# Patient Record
Sex: Female | Born: 1950 | Race: White | Hispanic: No | Marital: Married | Smoking: Current every day smoker
Health system: Southern US, Community
[De-identification: ages and names within clinical notes are randomized; demographics above are authoritative.]

## PROBLEM LIST (undated history)

## (undated) DIAGNOSIS — Z87898 Personal history of other specified conditions: Secondary | ICD-10-CM

## (undated) DIAGNOSIS — F32A Depression, unspecified: Secondary | ICD-10-CM

## (undated) DIAGNOSIS — F419 Anxiety disorder, unspecified: Secondary | ICD-10-CM

## (undated) DIAGNOSIS — M25562 Pain in left knee: Secondary | ICD-10-CM

## (undated) DIAGNOSIS — F329 Major depressive disorder, single episode, unspecified: Secondary | ICD-10-CM

## (undated) DIAGNOSIS — R0609 Other forms of dyspnea: Secondary | ICD-10-CM

## (undated) DIAGNOSIS — N159 Renal tubulo-interstitial disease, unspecified: Secondary | ICD-10-CM

## (undated) DIAGNOSIS — I200 Unstable angina: Secondary | ICD-10-CM

## (undated) DIAGNOSIS — R0600 Dyspnea, unspecified: Secondary | ICD-10-CM

## (undated) DIAGNOSIS — H9319 Tinnitus, unspecified ear: Secondary | ICD-10-CM

## (undated) DIAGNOSIS — R531 Weakness: Secondary | ICD-10-CM

## (undated) DIAGNOSIS — M1990 Unspecified osteoarthritis, unspecified site: Secondary | ICD-10-CM

## (undated) DIAGNOSIS — R42 Dizziness and giddiness: Secondary | ICD-10-CM

## (undated) DIAGNOSIS — R079 Chest pain, unspecified: Secondary | ICD-10-CM

## (undated) HISTORY — DX: Chest pain, unspecified: R07.9

## (undated) HISTORY — DX: Other forms of dyspnea: R06.09

## (undated) HISTORY — PX: CARDIAC CATHETERIZATION: SHX172

## (undated) HISTORY — DX: Depression, unspecified: F32.A

## (undated) HISTORY — DX: Dyspnea, unspecified: R06.00

## (undated) HISTORY — DX: Unspecified osteoarthritis, unspecified site: M19.90

## (undated) HISTORY — DX: Anxiety disorder, unspecified: F41.9

## (undated) HISTORY — DX: Major depressive disorder, single episode, unspecified: F32.9

## (undated) HISTORY — DX: Dizziness and giddiness: R42

## (undated) HISTORY — DX: Personal history of other specified conditions: Z87.898

## (undated) HISTORY — DX: Weakness: R53.1

## (undated) HISTORY — DX: Unstable angina: I20.0

## (undated) HISTORY — DX: Tinnitus, unspecified ear: H93.19

## (undated) HISTORY — PX: ABDOMINAL HYSTERECTOMY: SHX81

## (undated) HISTORY — PX: CERVICAL FUSION: SHX112

## (undated) HISTORY — DX: Pain in left knee: M25.562

## (undated) HISTORY — DX: Renal tubulo-interstitial disease, unspecified: N15.9

---

## 1997-12-09 ENCOUNTER — Other Ambulatory Visit: Admission: RE | Admit: 1997-12-09 | Discharge: 1997-12-09 | Payer: Self-pay | Admitting: *Deleted

## 1997-12-26 ENCOUNTER — Other Ambulatory Visit: Admission: RE | Admit: 1997-12-26 | Discharge: 1997-12-26 | Payer: Self-pay | Admitting: *Deleted

## 1998-05-07 ENCOUNTER — Other Ambulatory Visit: Admission: RE | Admit: 1998-05-07 | Discharge: 1998-05-07 | Payer: Self-pay | Admitting: *Deleted

## 1998-08-07 ENCOUNTER — Other Ambulatory Visit: Admission: RE | Admit: 1998-08-07 | Discharge: 1998-08-07 | Payer: Self-pay | Admitting: *Deleted

## 1998-09-21 ENCOUNTER — Ambulatory Visit (HOSPITAL_COMMUNITY): Admission: RE | Admit: 1998-09-21 | Discharge: 1998-09-21 | Payer: Self-pay | Admitting: *Deleted

## 1999-03-24 ENCOUNTER — Ambulatory Visit (HOSPITAL_COMMUNITY): Admission: RE | Admit: 1999-03-24 | Discharge: 1999-03-24 | Payer: Self-pay | Admitting: Family Medicine

## 1999-03-24 ENCOUNTER — Encounter: Payer: Self-pay | Admitting: Family Medicine

## 1999-06-15 ENCOUNTER — Other Ambulatory Visit: Admission: RE | Admit: 1999-06-15 | Discharge: 1999-06-15 | Payer: Self-pay | Admitting: *Deleted

## 1999-07-14 ENCOUNTER — Encounter: Payer: Self-pay | Admitting: *Deleted

## 1999-07-20 ENCOUNTER — Encounter (INDEPENDENT_AMBULATORY_CARE_PROVIDER_SITE_OTHER): Payer: Self-pay

## 1999-07-20 ENCOUNTER — Inpatient Hospital Stay (HOSPITAL_COMMUNITY): Admission: RE | Admit: 1999-07-20 | Discharge: 1999-07-23 | Payer: Self-pay | Admitting: *Deleted

## 1999-10-05 ENCOUNTER — Encounter: Admission: RE | Admit: 1999-10-05 | Discharge: 1999-10-05 | Payer: Self-pay | Admitting: *Deleted

## 1999-10-05 ENCOUNTER — Encounter: Payer: Self-pay | Admitting: *Deleted

## 1999-10-08 ENCOUNTER — Encounter: Admission: RE | Admit: 1999-10-08 | Discharge: 1999-10-08 | Payer: Self-pay | Admitting: *Deleted

## 1999-10-08 ENCOUNTER — Encounter: Payer: Self-pay | Admitting: *Deleted

## 1999-10-13 ENCOUNTER — Inpatient Hospital Stay (HOSPITAL_COMMUNITY): Admission: EM | Admit: 1999-10-13 | Discharge: 1999-10-16 | Payer: Self-pay | Admitting: Emergency Medicine

## 1999-10-13 ENCOUNTER — Encounter (INDEPENDENT_AMBULATORY_CARE_PROVIDER_SITE_OTHER): Payer: Self-pay | Admitting: Specialist

## 1999-10-13 ENCOUNTER — Encounter: Payer: Self-pay | Admitting: Emergency Medicine

## 1999-10-14 ENCOUNTER — Encounter: Payer: Self-pay | Admitting: Emergency Medicine

## 1999-10-14 ENCOUNTER — Encounter: Payer: Self-pay | Admitting: Family Medicine

## 1999-10-16 ENCOUNTER — Encounter: Payer: Self-pay | Admitting: Family Medicine

## 2000-06-14 ENCOUNTER — Other Ambulatory Visit: Admission: RE | Admit: 2000-06-14 | Discharge: 2000-06-14 | Payer: Self-pay | Admitting: *Deleted

## 2000-06-16 ENCOUNTER — Encounter: Admission: RE | Admit: 2000-06-16 | Discharge: 2000-06-16 | Payer: Self-pay | Admitting: *Deleted

## 2000-06-16 ENCOUNTER — Encounter: Payer: Self-pay | Admitting: *Deleted

## 2000-10-10 ENCOUNTER — Encounter: Payer: Self-pay | Admitting: Urology

## 2000-10-13 ENCOUNTER — Ambulatory Visit (HOSPITAL_COMMUNITY): Admission: RE | Admit: 2000-10-13 | Discharge: 2000-10-13 | Payer: Self-pay | Admitting: Urology

## 2000-10-13 ENCOUNTER — Encounter (INDEPENDENT_AMBULATORY_CARE_PROVIDER_SITE_OTHER): Payer: Self-pay | Admitting: Specialist

## 2001-07-12 ENCOUNTER — Other Ambulatory Visit: Admission: RE | Admit: 2001-07-12 | Discharge: 2001-07-12 | Payer: Self-pay | Admitting: *Deleted

## 2001-07-12 ENCOUNTER — Encounter: Payer: Self-pay | Admitting: *Deleted

## 2001-07-12 ENCOUNTER — Encounter: Admission: RE | Admit: 2001-07-12 | Discharge: 2001-07-12 | Payer: Self-pay | Admitting: *Deleted

## 2002-03-03 ENCOUNTER — Encounter: Payer: Self-pay | Admitting: Orthopedic Surgery

## 2002-03-03 ENCOUNTER — Encounter: Admission: RE | Admit: 2002-03-03 | Discharge: 2002-03-03 | Payer: Self-pay | Admitting: Orthopedic Surgery

## 2002-10-11 ENCOUNTER — Encounter: Admission: RE | Admit: 2002-10-11 | Discharge: 2002-10-11 | Payer: Self-pay | Admitting: Urology

## 2002-10-11 ENCOUNTER — Encounter: Payer: Self-pay | Admitting: Urology

## 2002-10-16 ENCOUNTER — Encounter (INDEPENDENT_AMBULATORY_CARE_PROVIDER_SITE_OTHER): Payer: Self-pay | Admitting: Specialist

## 2002-10-16 ENCOUNTER — Ambulatory Visit (HOSPITAL_BASED_OUTPATIENT_CLINIC_OR_DEPARTMENT_OTHER): Admission: RE | Admit: 2002-10-16 | Discharge: 2002-10-16 | Payer: Self-pay | Admitting: Urology

## 2002-10-16 HISTORY — PX: TRANSURETHRAL RESECTION OF BLADDER TUMOR: SHX2575

## 2002-10-29 ENCOUNTER — Other Ambulatory Visit: Admission: RE | Admit: 2002-10-29 | Discharge: 2002-10-29 | Payer: Self-pay | Admitting: *Deleted

## 2003-03-17 ENCOUNTER — Encounter: Payer: Self-pay | Admitting: Occupational Medicine

## 2003-03-17 ENCOUNTER — Encounter: Admission: RE | Admit: 2003-03-17 | Discharge: 2003-03-17 | Payer: Self-pay | Admitting: Occupational Medicine

## 2003-04-04 ENCOUNTER — Encounter: Admission: RE | Admit: 2003-04-04 | Discharge: 2003-06-02 | Payer: Self-pay | Admitting: Occupational Medicine

## 2003-04-17 ENCOUNTER — Encounter: Payer: Self-pay | Admitting: Occupational Medicine

## 2003-04-17 ENCOUNTER — Encounter: Admission: RE | Admit: 2003-04-17 | Discharge: 2003-04-17 | Payer: Self-pay | Admitting: Occupational Medicine

## 2004-01-28 ENCOUNTER — Other Ambulatory Visit: Admission: RE | Admit: 2004-01-28 | Discharge: 2004-01-28 | Payer: Self-pay | Admitting: *Deleted

## 2005-05-05 ENCOUNTER — Other Ambulatory Visit: Admission: RE | Admit: 2005-05-05 | Discharge: 2005-05-05 | Payer: Self-pay | Admitting: *Deleted

## 2006-08-28 ENCOUNTER — Encounter: Admission: RE | Admit: 2006-08-28 | Discharge: 2006-08-28 | Payer: Self-pay | Admitting: Gastroenterology

## 2007-04-12 ENCOUNTER — Other Ambulatory Visit: Admission: RE | Admit: 2007-04-12 | Discharge: 2007-04-12 | Payer: Self-pay | Admitting: *Deleted

## 2009-02-09 ENCOUNTER — Encounter: Payer: Self-pay | Admitting: Cardiovascular Disease

## 2010-09-19 ENCOUNTER — Encounter: Payer: Self-pay | Admitting: Family Medicine

## 2010-10-13 HISTORY — PX: CYSTOSCOPY: SUR368

## 2011-01-14 NOTE — Op Note (Signed)
[facility]  Patient:    Meagan Lambert, Meagan Lambert                         MRN: [id number] Proc. Date: 10/13/00 Adm. Date:  [id number] Attending:  Londell Moh CC:         Almedia Balls. Randell Patient, M.D.  Gloriajean Dell. Andrey Campanile, M.D.   Operative Report  PREOPERATIVE DIAGNOSIS:  Abdominal pain, rule out interstitial cystitis.  POSTOPERATIVE DIAGNOSIS:  Abdominal pain, rule out interstitial cystitis.  OPERATION:  Cystoscopy, urethral calibration, hydrodistention of bladder with Marcaine and Pyridium solution, Marcaine and Kenalog injection.  SURGEON:  Jamison Neighbor, M.D.  ANESTHESIA:  General.  COMPLICATIONS:  None.  DRAINS:  None.  INDICATIONS:  This is a 60 year old female complaining of abdominal pain and also some microscopic hematuria.  An IVP was obtained which showed normal upper tracts.  Cystoscopy showed no abnormality of the bladder.  No large tumors or stones could be seen.  The patient was on antibiotic prophylaxis and her urine cleared.  The patient continues to describe chronic lower abdominal pain and is now to undergo diagnostic cystoscopy with hydrodistention.  She understands the risks and benefits of the procedure and gave full informed consent.  DESCRIPTION OF PROCEDURE:  After successful general anesthesia, the patient was placed in the dorsal lithotomy position, prepped with Betadine and draped in the usual sterile fashion.  Cystoscopy was performed.  The urethra was first calibrated up to 12 [place] ruling out stenosis or stricture.  A careful bimanual examination showed no evidence of any masses on examination.  No cystocele, enterocele or rectocele.  The bladder was carefully inspected.  It was free of any tumor or stones.  Both ureteral orifices were normal in configuration or location.  There was a very minimal amount of metaplasia but nothing suggestive of any abnormalities.  The urine coming from each ureter was clear.  The bladder was  distended under pressure with 100 cm of water for five minutes, and the bladder was drained.  The mucosa appeared normal.  There was no evidence of significant glomerulation and the bladder capacity was 800 cc.  These findings are certainly not diagnostic for interstitial cystitis.  A biopsy was taken and will be sent for evaluation.  A mixture of Marcaine and Pyridium was infiltrated into the bladder.  Marcaine and Kenalog were injected periurethrally.  The patient was given intraoperative Toradol and Zofran.  She will receive a B&S suppository.  DISPOSITION:  She will be sent home with Lorcet Plus, Pyridium Plus and doxycycline.  She will return to see me in two weeks time. DD:  10/13/00 TD:  10/14/00 Job: [id number]

## 2011-01-14 NOTE — Op Note (Signed)
   NAME:  [name], [name]                            ACCOUNT NO.:  [id number]   MEDICAL RECORD NO.:  [id number]                   PATIENT TYPE:  AMB   LOCATION:  [facility]                                 FACILITY:  [facility]   PHYSICIAN:  Rozanna Boer., M.D.      DATE OF BIRTH:  26-May-1951   DATE OF PROCEDURE:  10/16/2002  DATE OF DISCHARGE:                                 OPERATIVE REPORT   PREOPERATIVE DIAGNOSIS:  Hematuria, irritated bladder symptoms and lesion  inflammatory posterior base of the bladder.   POSTOPERATIVE DIAGNOSIS:  Hematuria, irritated bladder symptoms and lesion  inflammatory posterior base of the bladder.   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   SURGEON:  Courtney Paris, M.D.   ANESTHESIA:  General.   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  Renal ultrasound was negative July 2003.  Previous cysto May 2003  was negative.  She had a total abdominal hysterectomy November 2000 and neck  surgery in 1993.   DESCRIPTION OF PROCEDURE:  The patient was placed on the operating table in  dorsal lithotomy position.  After satisfactory induction of general  anesthesia, she was prepped and draped with Betadine.  A 22 panendoscope was  inserted and the bladder inspected.  The only lesion of consequence was in  the posterior base but there was another one just a little off and more  medial to this.  Pictures were taken as well as closeups and then using the  biopsy forceps, I was able to remove the mucosa over about a 3 to 4 cm area  encompassing both lesions.  About at least six biopsies were taken.  This  effectively removed the lesions and I used the Bugbee electrode to cauterize  the base, again about 3 to 4 cm was  noted.   The bladder was again drained, scope removed and a B&O suppository inserted  and the patient taken to recovery room in good condition to be later  discharged as an outpatient.                                                Rozanna Boer., M.D.    HMK/MEDQ  D:  10/16/2002  T:  10/16/2002  Job:  [id number]

## 2011-01-14 NOTE — H&P (Signed)
[place]  Patient:    Meagan Lambert, Meagan Lambert                         MRN: [id number] Adm. Date:  [id number] Attending:  Verneita Griffes                         History and Physical  CHIEF COMPLAINT:  Chest pain.  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  She felt it  under her breast bone and left chest and radiating into the left arm to the elbow. She tried leaning into the wall; it did not give her relief.  The pain persisted and two and a half hours later, EMS was called and she was brought to this facility.  Initial EKG and cardiac enzymes were normal.  The pain improved with  sublingual nitroglycerin.  Patient felt okay now.  It was felt, because of the severity of the history, that she should be admitted and as a rule out.  PAST MEDICAL HISTORY:  Significant in that she had an abdominal total hysterectomy in November of 2000 by Dr. Vear Clock.  Initially, she did well with this surgery but began to have abdominal pain, both upper and lower, particularly symptomatic after eating with bloating and pain.  She returned to work for the first time in January and was only able to work one day because of abdominal pain, again was brought out of work and she saw Dr. Randell Patient who performed the hysterectomy. She had a CT of the abdomen and IVP; both were normal.  She returned to work yesterday.  Had an appointment for GI evaluation for October 30, 1999.  Has never ad similar symptoms before.  It should also be noted she had a four-level cervical  surgery by Dr. Stefani Dama for DJD in 1994 and she has had childbirth x 4.  Four daughters, age 22 to 48.  ALLERGIES:  She has no allergies.  MEDICATIONS:  Aciphex, Xanax, Vicodin and Tums.  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  She is married.  She [place] the [place] of [address]  She has never exercised regularly.  She smokes a pack of cigarettes a day and has a 33-pack-year history.  She uses a minimal amount of ethanol.  FAMILY HISTORY:  No hereditary or familial diseases.  REVIEW OF SYSTEMS:  Not relevant to the current complaints.  She does report that she had a normal stress test in 1989.  PHYSICAL EXAMINATION:  VITAL SIGNS:  Blood pressure 120/80, pulse 76, respiratory rate 20, temperature  ______ .  GENERAL:  She is alert, worried, in no acute distress, oriented x 3, in no apparent pain now.  HEENT:  Normocephalic.  EACs unobstructive.  TMs translucent.  No middle ear fluid. Fundi showed normal disks without hemorrhages or exudates.  Throat is clear. Nares unobstructed.  She has fair dental health.  Throat is clear.  NECK:  Supple without nodes, masses, bruits or thyroid enlargement.  CHEST:  Expands symmetrically.  No wheezes, rales or rhonchi.  HEART:  Normal sinus clinically.  No murmurs, rubs, or gallops.  BREASTS:  Symmetrical but examination  is deferred because of a recent exam by Dr. Randell Patient.  ABDOMEN:  Soft.  There is tenderness in the lower quadrants.  A Pfannenstiel incision is healing well.  Bowel sounds are normal.  There are no masses or organomegaly.  GYNECOLOGIC:  Exam is deferred because of a recent normal exam by Dr. Randell Patient.  EXTREMITIES:  Good pulses without edema.  CENTRAL NERVOUS SYSTEM:  Cranial nerves II-XII intact.  No gross motor or sensory deficits.  Cerebellar function is intact.  Deep tendon reflexes are present, equal and brisk.  IMPRESSION: 1. Chest pain of uncertain cause, rule out myocardial infarction. 2. Debilitating abdominal pain for three months. 3. Total abdominal hysterectomy in November of 2000. 4. Childbirth x 4. 5. Nicotine use at one pack a day. 6. Multilevel  cervical laminectomies in 1994. DD:  10/14/99 TD:  10/14/99 Job: [id number]

## 2011-01-14 NOTE — Discharge Summary (Signed)
[place]. [facility]  Patient:    Meagan Lambert, Meagan Lambert                         MRN: [id number] Adm. Date:  [id number] Disc. Date: [id number] Attending:  Verneita Griffes CC:         Genene Churn. Sherin Quarry, M.D.                           Discharge Summary  FINAL DIAGNOSES: 1. Musculoskeletal chest pain. 2. Gastrointestinal symptoms and abdominal pain secondary to irritable bowel    syndrome. 3. Estrogen deficiency, three months total abdominal hysterectomy and bilateral    salpingo-oophorectomy for fibroids and endometriosis. 4. Gastroesophageal reflux disorder. 5. Cigarette smoking. 6. Symptoms of anxiety and depression secondary to above problems. 7. Extra caffeine use, which may contribute to some of the above problems.  CONSULTATIONS:  Dr. Boyd Kerbs, gastroenterology.  PROCEDURES: 1. Abdominal ultrasound. 2. PIPIDA scan. 3. Barium enema. 4. Upper GI endoscopy.  HISTORY OF PRESENT ILLNESS AND HOSPITAL COURSE:  The patient is a 60 year old married white female who was admitted with chest pain starting in her left shoulder and radiating around to the left arm and substernal area.  She also has had a history of excessive abdominal pain, bloating, gas above and below, burning sensation in lower abdomen, some reflux symptoms with bad taste in mouth, and alternating constipation and diarrhea, all these symptoms worse after a TAH/BSO  three months ago.  She was on estrogen and testosterone replacement until a couple of weeks ago.  Xanax helped with some of this.  She was admitted for cardiac and gastrointestinal evaluation.  Cardiac enzymes were negative x 3.  Telemetry was  normal sinus rhythm the entire hospitalization.  Gastrointestinal evaluation was negative including upper GI endoscopy and barium enema.  Prior to admission, she had had CT scan of the abdomen, IVP, both of which were normal.  Also, in the hospital, had abdominal ultrasound and PIPIDA  scan which were normal.  She continued to have some left shoulder pain, worse when she moved certain ways. as kept on a liquid diet while in the hospital.  Developed no fever.  Vital signs remained stable.  She was discharged home unimproved, but reassured, to begin treating for above problems.  LABORATORY DATA:  Ultrasound of abdomen:  No evidence of gallstones. Gallbladder normal without thickening.  No biliary dilatation.  Pancreas normal.  Kidneys normal.  Liver itself within normal limits.  PIPIDA scan:  Normal study. Normal gallbladder ejection fraction of 74%.  Excellent visualization of the small bowel, which was also normal.  Portable chest x-ray done in the emergency room showed o acute pulmonary disease, possible scar versus atelectasis at right base.  Blood gas on room air revealed pH 7.42, pCO2 40.  CBC:  White count 6800, hemoglobin 15.0, platelet count 241,000, MCV 86.  Differential with 63% neutrophils, 29% lymphocytes, 4 monos, 3 eos.  Sodium 140, potassium 4.0, BUN 11, creatinine 0.6, glucose 93, total bilirubin 0.8, alkaline phosphatase 58, SGOT 9, SGPT 14, total protein 5.8, albumin 3.6, calcium 8.8.  Troponin less than 0.03 n three occasions over the first hospital day.  CK 37, MB 0.5.  Urinalysis normal  except small blood, 0-5 white cells, 0-5 red cells.  Negative for ketones, nitrites, leukocytes, protein.  Occult blood in stool negative.  CLOtest obtained at upper GI endoscopy was negative.  No  cultures were done.  EKG, 12-lead, normal sinus rhythm, normal EKG.  DISCHARGE PLANS:  The patient is discharged home to advance her diet carefully.  Recommend limiting caffeine.  Suggest she quit smoking cigarettes at some point, but nicotine patch, which was put on in the hospital, was taken off at discharge. A note to be off of work from the day she came in on October 13, 1999, through one week later, October 19, 1999.  Resume work on October 20, 1999.   Recheck in office in one week.  Suggest walking for exercise.  Try to get adequate sleep.  DISCHARGE MEDICATIONS: 1. Xanax 0.5 mg 1/2-1 t.i.d. 2. Premarin 1.25 mg daily p.o. 3. Zantac 150 mg b.i.d. 4. Levsinex 0.375 mg twice a day. 5. Tylenol 2 extra strength up to four times a day as needed. 6. Motrin 200 mg 2-3 t.i.d. as needed. 7. Aspirin 81 mg daily.  Consider adding antidepressant such as Wellbutrin in the near future, and encourage to stop smoking cigarettes at that time. DD:  10/16/99 TD:  10/17/99 Job: [id number]

## 2012-01-13 ENCOUNTER — Encounter: Payer: Self-pay | Admitting: *Deleted

## 2012-02-16 ENCOUNTER — Encounter: Payer: Self-pay | Admitting: Cardiovascular Disease

## 2012-03-02 DIAGNOSIS — F32A Depression, unspecified: Secondary | ICD-10-CM | POA: Insufficient documentation

## 2012-03-02 DIAGNOSIS — R531 Weakness: Secondary | ICD-10-CM | POA: Insufficient documentation

## 2012-03-02 DIAGNOSIS — R079 Chest pain, unspecified: Secondary | ICD-10-CM | POA: Insufficient documentation

## 2012-03-02 DIAGNOSIS — M25562 Pain in left knee: Secondary | ICD-10-CM | POA: Insufficient documentation

## 2012-03-02 DIAGNOSIS — H9319 Tinnitus, unspecified ear: Secondary | ICD-10-CM | POA: Insufficient documentation

## 2012-03-02 DIAGNOSIS — R0609 Other forms of dyspnea: Secondary | ICD-10-CM

## 2012-03-02 DIAGNOSIS — M1990 Unspecified osteoarthritis, unspecified site: Secondary | ICD-10-CM | POA: Insufficient documentation

## 2012-03-02 DIAGNOSIS — F419 Anxiety disorder, unspecified: Secondary | ICD-10-CM | POA: Insufficient documentation

## 2012-03-02 DIAGNOSIS — Z87898 Personal history of other specified conditions: Secondary | ICD-10-CM | POA: Insufficient documentation

## 2012-03-02 DIAGNOSIS — R42 Dizziness and giddiness: Secondary | ICD-10-CM | POA: Insufficient documentation

## 2012-03-02 DIAGNOSIS — F329 Major depressive disorder, single episode, unspecified: Secondary | ICD-10-CM | POA: Insufficient documentation

## 2012-03-02 DIAGNOSIS — I200 Unstable angina: Secondary | ICD-10-CM | POA: Insufficient documentation

## 2012-03-09 ENCOUNTER — Institutional Professional Consult (permissible substitution): Payer: Self-pay | Admitting: Cardiovascular Disease

## 2012-04-13 ENCOUNTER — Ambulatory Visit (INDEPENDENT_AMBULATORY_CARE_PROVIDER_SITE_OTHER): Payer: BC Managed Care – PPO | Admitting: Cardiovascular Disease

## 2012-04-13 ENCOUNTER — Encounter: Payer: Self-pay | Admitting: Cardiovascular Disease

## 2012-04-13 VITALS — BP 117/65 | HR 69 | Resp 18 | Ht 62.0 in | Wt 145.4 lb

## 2012-04-13 DIAGNOSIS — R0989 Other specified symptoms and signs involving the circulatory and respiratory systems: Secondary | ICD-10-CM

## 2012-04-13 DIAGNOSIS — R9389 Abnormal findings on diagnostic imaging of other specified body structures: Secondary | ICD-10-CM

## 2012-04-13 DIAGNOSIS — I447 Left bundle-branch block, unspecified: Secondary | ICD-10-CM | POA: Insufficient documentation

## 2012-04-13 DIAGNOSIS — R079 Chest pain, unspecified: Secondary | ICD-10-CM

## 2012-04-13 DIAGNOSIS — R0600 Dyspnea, unspecified: Secondary | ICD-10-CM

## 2012-04-13 DIAGNOSIS — F411 Generalized anxiety disorder: Secondary | ICD-10-CM

## 2012-04-13 DIAGNOSIS — R918 Other nonspecific abnormal finding of lung field: Secondary | ICD-10-CM

## 2012-04-13 DIAGNOSIS — F419 Anxiety disorder, unspecified: Secondary | ICD-10-CM

## 2012-04-13 NOTE — Assessment & Plan Note (Signed)
Continue Paxil appears stable

## 2012-04-13 NOTE — Assessment & Plan Note (Signed)
R/O DCM and CAD  Echo and myovue

## 2012-04-13 NOTE — Assessment & Plan Note (Signed)
Atypical but LBBB and heavy smoker  Lexiscan myovue

## 2012-04-13 NOTE — Progress Notes (Addendum)
Patient ID: Meagan Lambert, female   DOB: 1951-04-09, 61 y.o.   MRN: [id number] 61 yo smoker referred by Dr Collins Scotland for abnormal ECG.  Reviewed from6/20/13  SR ICLBBB.  Notes indicated change from previous ECG.  Cholesterol and labs reviewed TC 178 HDL 59 LDL 68 Apparently recent CXR with bronchitic changes.  Has had Chantix for smoking but unable to quit.  Dsypnea related to COPD.  Counseled for less than 10 minutes on cessation.  Atypical SSCP since April.  Sharp left sided with exertion but also at rest on coumputer.  Works at [employer] in [employer]. Had cardiac evaluation in past two years ? N[facility]ser with [facility] cardiology  Need records from [facility]  Did get myovue result done by Dr Elease Hashimoto 02/09/09  Exercised 9 minutes Normal with EF 83%  ROS: Denies fever, malais, weight loss, blurry vision, decreased visual acuity, cough, sputum, SOB, hemoptysis, pleuritic pain, palpitaitons, heartburn, abdominal pain, melena, lower extremity edema, claudication, or rash.  All other systems reviewed and negative   General: Affect appropriate Healthy:  appears stated age HEENT: normal Neck supple with no adenopathy JVP normal no bruits no thyromegaly Lungs clear with no wheezing and good diaphragmatic motion Heart:  S1/S2 no murmur,rub, gallop or click PMI normal Abdomen: benighn, BS positve, no tenderness, no AAA no bruit.  No HSM or HJR Distal pulses intact with no bruits No edema Neuro non-focal Skin warm and dry No muscular weakness  Medications Current Outpatient Prescriptions  Medication Sig Dispense Refill  . ALPRAZolam (XANAX) 0.5 MG tablet Take 0.5 mg by mouth at bedtime.       . fluconazole (DIFLUCAN) 150 MG tablet Take 150 mg by mouth daily.       Marland Kitchen HYDROcodone-acetaminophen (LORTAB) 7.5-500 MG per tablet Take 2 tablets by mouth daily.       Marland Kitchen levothyroxine (SYNTHROID, LEVOTHROID) 50 MCG tablet Take 50 mcg by mouth daily.       Marland Kitchen PARoxetine (PAXIL) 40 MG tablet Take 40  mg by mouth.         Allergies Sulfur  Family History: Family History  Problem Relation Age of Onset  . Cancer Mother     lung  . Kidney failure Father     Social History: History   Social History  . Marital Status: Married    Spouse Name: N/A    Number of Children: 4  . Years of Education: N/A   Occupational History  . LPN     [employer]   Social History Main Topics  . Smoking status: Current Everyday Smoker -- 1.0 packs/day for 38 years    Types: Cigarettes  . Smokeless tobacco: Not on file  . Alcohol Use: Yes  . Drug Use: No  . Sexually Active:    Other Topics Concern  . Not on file   Social History Narrative  . No narrative on file    Electrocardiogram:  NSR ICLBBB  Today NSR rate 69 ICLBBB QRS 114 msec.    Assessment and Plan

## 2012-04-13 NOTE — Patient Instructions (Signed)
Your physician wants you to follow-up in:   YEAR WITH DR Haywood Filler will receive a reminder letter in the mail two months in advance. If you don't receive a letter, please call our office to schedule the follow-up appointment. Your physician recommends that you continue on your current medications as directed. Please refer to the Current Medication list given to you today. Your physician has requested that you have an echocardiogram. Echocardiography is a painless test that uses sound waves to create images of your heart. It provides your doctor with information about the size and shape of your heart and how well your heart's chambers and valves are working. This procedure takes approximately one hour. There are no restrictions for this procedure.  DX DYSPNEA Your physician has requested that you have a lexiscan myoview. For further information please visit [email or web address] Please follow instruction sheet, as given. DX CHEST PAIN  LBBB

## 2012-04-13 NOTE — Assessment & Plan Note (Signed)
Related to smoking and bronchitis.  Echo to check RV/LV function R/O pulmonary hypertension

## 2012-04-18 ENCOUNTER — Other Ambulatory Visit (HOSPITAL_COMMUNITY): Payer: BC Managed Care – PPO

## 2012-04-19 ENCOUNTER — Ambulatory Visit (HOSPITAL_COMMUNITY): Payer: BC Managed Care – PPO | Attending: Cardiology

## 2012-04-19 DIAGNOSIS — F17200 Nicotine dependence, unspecified, uncomplicated: Secondary | ICD-10-CM | POA: Insufficient documentation

## 2012-04-19 DIAGNOSIS — I059 Rheumatic mitral valve disease, unspecified: Secondary | ICD-10-CM | POA: Insufficient documentation

## 2012-04-19 DIAGNOSIS — R072 Precordial pain: Secondary | ICD-10-CM

## 2012-04-19 DIAGNOSIS — R9431 Abnormal electrocardiogram [ECG] [EKG]: Secondary | ICD-10-CM

## 2012-04-19 DIAGNOSIS — R079 Chest pain, unspecified: Secondary | ICD-10-CM | POA: Insufficient documentation

## 2012-04-19 DIAGNOSIS — R0609 Other forms of dyspnea: Secondary | ICD-10-CM

## 2012-04-19 DIAGNOSIS — J449 Chronic obstructive pulmonary disease, unspecified: Secondary | ICD-10-CM | POA: Insufficient documentation

## 2012-04-19 DIAGNOSIS — J4489 Other specified chronic obstructive pulmonary disease: Secondary | ICD-10-CM | POA: Insufficient documentation

## 2012-04-19 DIAGNOSIS — I447 Left bundle-branch block, unspecified: Secondary | ICD-10-CM

## 2012-04-19 DIAGNOSIS — R0989 Other specified symptoms and signs involving the circulatory and respiratory systems: Secondary | ICD-10-CM | POA: Insufficient documentation

## 2012-04-19 DIAGNOSIS — R0600 Dyspnea, unspecified: Secondary | ICD-10-CM

## 2012-04-19 DIAGNOSIS — I079 Rheumatic tricuspid valve disease, unspecified: Secondary | ICD-10-CM | POA: Insufficient documentation

## 2012-04-19 DIAGNOSIS — I379 Nonrheumatic pulmonary valve disorder, unspecified: Secondary | ICD-10-CM | POA: Insufficient documentation

## 2012-04-19 NOTE — Progress Notes (Signed)
Echocardiogram performed.  

## 2012-04-24 ENCOUNTER — Ambulatory Visit (HOSPITAL_COMMUNITY): Payer: BC Managed Care – PPO | Attending: Cardiology | Admitting: Radiology

## 2012-04-24 VITALS — BP 122/81 | HR 60 | Ht 61.5 in | Wt 142.0 lb

## 2012-04-24 DIAGNOSIS — R0989 Other specified symptoms and signs involving the circulatory and respiratory systems: Secondary | ICD-10-CM | POA: Insufficient documentation

## 2012-04-24 DIAGNOSIS — R0602 Shortness of breath: Secondary | ICD-10-CM

## 2012-04-24 DIAGNOSIS — R079 Chest pain, unspecified: Secondary | ICD-10-CM | POA: Insufficient documentation

## 2012-04-24 DIAGNOSIS — Z8249 Family history of ischemic heart disease and other diseases of the circulatory system: Secondary | ICD-10-CM | POA: Insufficient documentation

## 2012-04-24 DIAGNOSIS — R0609 Other forms of dyspnea: Secondary | ICD-10-CM | POA: Insufficient documentation

## 2012-04-24 DIAGNOSIS — I447 Left bundle-branch block, unspecified: Secondary | ICD-10-CM | POA: Insufficient documentation

## 2012-04-24 DIAGNOSIS — R9431 Abnormal electrocardiogram [ECG] [EKG]: Secondary | ICD-10-CM | POA: Insufficient documentation

## 2012-04-24 MED ORDER — TECHNETIUM TC 99M TETROFOSMIN IV KIT
11.0000 | PACK | Freq: Once | INTRAVENOUS | Status: AC | PRN
  Administered 2012-04-24: 11 via INTRAVENOUS

## 2012-04-24 MED ORDER — ADENOSINE (DIAGNOSTIC) 3 MG/ML IV SOLN
0.5600 mg/kg | Freq: Once | INTRAVENOUS | Status: AC
  Administered 2012-04-24: 36 mg via INTRAVENOUS

## 2012-04-24 MED ORDER — TECHNETIUM TC 99M TETROFOSMIN IV KIT
33.0000 | PACK | Freq: Once | INTRAVENOUS | Status: AC | PRN
  Administered 2012-04-24: 33 via INTRAVENOUS

## 2012-04-24 NOTE — Progress Notes (Signed)
[facility] SITE 3 NUCLEAR MED [address] [place] [address] [phone]  Cardiology Nuclear Med Study  Meagan Lambert is a 61 y.o. female     MRN : [id number]     DOB: 1951/07/27  Procedure Date: 04/24/2012  Nuclear Med Background Indication for Stress Test:  Evaluation for Ischemia and Abnormal EKG  History:  '10 MPS:No ischemia, EF=83%; 04/19/12 Echo:EF=60% Cardiac Risk Factors: New LBBB, Family History-CAD, TIA and Smoker  Symptoms:  Chest Pain/Tightness with and without Exertion (last episode of chest discomfort was about a month ago), DOE, Fatigue and Palpitations   Nuclear Pre-Procedure Caffeine/Decaff Intake:  None > 12 hrs NPO After: 7:00pm   Lungs:  Clear. O2 Sat: 98% on room air. IV 0.9% NS with Angio Cath:  20g  IV Site: R Antecubital x 1, tolerated well IV Started by:  Irean Hong, RN  Chest Size (in):  34 Cup Size: C  Height: 5' 1.5" (1.562 m)  Weight:  142 lb (64.411 kg)  BMI:  Body mass index is 26.40 kg/(m^2). Tech Comments:  Changed to Adenosine due to LBBB. Irean Hong, RN    Nuclear Med Study 1 or 2 day study: 1 day  Stress Test Type:  Adenosine  Reading MD: Willa Rough, MD  Order Authorizing Provider:  Charlton Haws, MD  Resting Radionuclide: Technetium 43m Tetrofosmin  Resting Radionuclide Dose: 11.0 mCi   Stress Radionuclide:  Technetium 26m Tetrofosmin  Stress Radionuclide Dose: 33.0 mCi           Stress Protocol Rest HR: 60 Stress HR: 71  Rest BP: 122/81 Stress BP: 126/80  Exercise Time (min): n/a METS: n/a   Predicted Max HR: 158 bpm % Max HR: 44.94 bpm Rate Pressure Product: 8946   Dose of Adenosine (mg):  36.1 Dose of Lexiscan: n/a mg  Dose of Atropine (mg): n/a Dose of Dobutamine: n/a mcg/kg/min (at max HR)  Stress Test Technologist: Smiley Houseman, CMA-N  Nuclear Technologist:  Domenic Polite, CNMT     Rest Procedure:  Myocardial perfusion imaging was performed at rest 45 minutes following the intravenous  administration of Technetium 36m Tetrofosmin.  Rest ECG: LBBB with rare PAC.  Stress Procedure:  The patient received IV adenosine at 140 mcg/kg/min for 4 minutes.  There were no significant changes with infusion.  She did c/o chest pressure with infusion.  Technetium 54m Tetrofosmin was injected at the 2 minute mark and quantitative spect images were obtained after a 45 minute delay.  Stress ECG: Uninteretable due to baseline LBBB  QPS Raw Data Images:  Normal; no motion artifact; normal heart/lung ratio. Stress Images:  Normal homogeneous uptake in all areas of the myocardium. Rest Images:  Normal homogeneous uptake in all areas of the myocardium. Subtraction (SDS):  There is no evidence of scar or ischemia. Transient Ischemic Dilatation (Normal <1.22):  1.06 Lung/Heart Ratio (Normal <0.45):  0.31  Quantitative Gated Spect Images QGS EDV:  63 ml QGS ESV:  20 ml  Impression Exercise Capacity:  Adenosine study with no exercise. BP Response:  Normal blood pressure response. Clinical Symptoms:  Chest pain/burning.  ECG Impression:  LBBB, no change with infusion.  Comparison with Prior Nuclear Study: No images to compare  Overall Impression:  Normal stress nuclear study.  LV Ejection Fraction: 68%.  LV Wall Motion:  NL LV Function; NL Wall Motion  Meagan Lambert 04/24/2012

## 2012-07-16 ENCOUNTER — Encounter (HOSPITAL_COMMUNITY): Payer: Self-pay | Admitting: Emergency Medicine

## 2012-07-16 ENCOUNTER — Emergency Department (HOSPITAL_COMMUNITY)
Admission: EM | Admit: 2012-07-16 | Discharge: 2012-07-16 | Disposition: A | Discharge status: Home / Self Care | Payer: BC Managed Care – PPO | Attending: Emergency Medicine | Admitting: Emergency Medicine

## 2012-07-16 DIAGNOSIS — F3289 Other specified depressive episodes: Secondary | ICD-10-CM | POA: Insufficient documentation

## 2012-07-16 DIAGNOSIS — Z87448 Personal history of other diseases of urinary system: Secondary | ICD-10-CM | POA: Insufficient documentation

## 2012-07-16 DIAGNOSIS — N390 Urinary tract infection, site not specified: Secondary | ICD-10-CM | POA: Insufficient documentation

## 2012-07-16 DIAGNOSIS — I200 Unstable angina: Secondary | ICD-10-CM | POA: Insufficient documentation

## 2012-07-16 DIAGNOSIS — R51 Headache: Secondary | ICD-10-CM | POA: Insufficient documentation

## 2012-07-16 DIAGNOSIS — Z79899 Other long term (current) drug therapy: Secondary | ICD-10-CM | POA: Insufficient documentation

## 2012-07-16 DIAGNOSIS — R5383 Other fatigue: Secondary | ICD-10-CM | POA: Insufficient documentation

## 2012-07-16 DIAGNOSIS — Z8709 Personal history of other diseases of the respiratory system: Secondary | ICD-10-CM | POA: Insufficient documentation

## 2012-07-16 DIAGNOSIS — R55 Syncope and collapse: Secondary | ICD-10-CM | POA: Insufficient documentation

## 2012-07-16 DIAGNOSIS — F329 Major depressive disorder, single episode, unspecified: Secondary | ICD-10-CM | POA: Insufficient documentation

## 2012-07-16 DIAGNOSIS — R5381 Other malaise: Secondary | ICD-10-CM | POA: Insufficient documentation

## 2012-07-16 DIAGNOSIS — R61 Generalized hyperhidrosis: Secondary | ICD-10-CM | POA: Insufficient documentation

## 2012-07-16 DIAGNOSIS — Z8669 Personal history of other diseases of the nervous system and sense organs: Secondary | ICD-10-CM | POA: Insufficient documentation

## 2012-07-16 DIAGNOSIS — F17200 Nicotine dependence, unspecified, uncomplicated: Secondary | ICD-10-CM | POA: Insufficient documentation

## 2012-07-16 DIAGNOSIS — R110 Nausea: Secondary | ICD-10-CM | POA: Insufficient documentation

## 2012-07-16 DIAGNOSIS — M1990 Unspecified osteoarthritis, unspecified site: Secondary | ICD-10-CM | POA: Insufficient documentation

## 2012-07-16 DIAGNOSIS — F411 Generalized anxiety disorder: Secondary | ICD-10-CM | POA: Insufficient documentation

## 2012-07-16 LAB — CBC WITH DIFFERENTIAL/PLATELET
Basophils Absolute: 0 10*3/uL (ref 0.0–0.1)
Lymphocytes Relative: 16 % (ref 12–46)
Lymphs Abs: 1.7 10*3/uL (ref 0.7–4.0)
Neutrophils Relative %: 75 % (ref 43–77)
Platelets: 279 10*3/uL (ref 150–400)
RBC: 4.45 MIL/uL (ref 3.87–5.11)
RDW: 13.1 % (ref 11.5–15.5)
WBC: 10.7 10*3/uL — ABNORMAL HIGH (ref 4.0–10.5)

## 2012-07-16 LAB — URINALYSIS, ROUTINE W REFLEX MICROSCOPIC
Bilirubin Urine: NEGATIVE
Ketones, ur: NEGATIVE mg/dL
Nitrite: NEGATIVE
Specific Gravity, Urine: 1.008 (ref 1.005–1.030)
pH: 5.5 (ref 5.0–8.0)

## 2012-07-16 LAB — COMPREHENSIVE METABOLIC PANEL
ALT: 17 U/L (ref 0–35)
AST: 19 U/L (ref 0–37)
Alkaline Phosphatase: 98 U/L (ref 39–117)
CO2: 29 mEq/L (ref 19–32)
GFR calc Af Amer: 86 mL/min — ABNORMAL LOW (ref >90)
GFR calc non Af Amer: 74 mL/min — ABNORMAL LOW (ref >90)
Glucose, Bld: 104 mg/dL — ABNORMAL HIGH (ref 70–99)
Potassium: 3.7 mEq/L (ref 3.5–5.1)
Sodium: 139 mEq/L (ref 135–145)
Total Protein: 6.9 g/dL (ref 6.0–8.3)

## 2012-07-16 LAB — POCT I-STAT TROPONIN I: Troponin i, poc: 0 ng/mL (ref 0.00–0.08)

## 2012-07-16 LAB — URINE MICROSCOPIC-ADD ON

## 2012-07-16 MED ORDER — SODIUM CHLORIDE 0.9 % IV SOLN
1000.0000 mL | Freq: Once | INTRAVENOUS | Status: AC
  Administered 2012-07-16: 1000 mL via INTRAVENOUS

## 2012-07-16 MED ORDER — SODIUM CHLORIDE 0.9 % IV SOLN
1000.0000 mL | INTRAVENOUS | Status: DC
  Administered 2012-07-16: 1000 mL via INTRAVENOUS

## 2012-07-16 MED ORDER — CEPHALEXIN 500 MG PO CAPS: 500.0000 mg | ORAL_CAPSULE | Freq: Four times a day (QID) | ORAL | Status: DC

## 2012-07-16 NOTE — ED Notes (Signed)
Pt reports feeling better now.  Eating sandwich, and drinking coke.  No pain.  Ambulated to bathroom to give urine sample.

## 2012-07-16 NOTE — ED Notes (Signed)
Bed:WA02<BR> Expected date:<BR> Expected time:<BR> Means of arrival:<BR> Comments:<BR> EMS

## 2012-07-16 NOTE — ED Notes (Signed)
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  Suddenly became nauseated, diaphoretic, and dizzy at work today.  EMS reports patient said she became very hot and started having symptoms.  It was hot and humid in the building according to EMS.  Pt denies chest pain or SOB.  History of left bundle branch block.  Pt had 4mg  Zofran in route.

## 2012-07-16 NOTE — ED Provider Notes (Signed)
History    CSN: [id number] Arrival date & time 07/16/12  1602 First MD Initiated Contact with Patient 07/16/12 1620    CC: sweating, lightheadedness and weakness  HPI Comments: Pt was at work and started to feel very hot.  This all started around 3:15.  Other people felt that it was warm but they were not sweating. Pt began sweating and then became nauseated.  She then felt lightheaded.  She did not lose conscioussness.  911 was called.  They did note that the building was hot and humid.    Patient is a 61 y.o. female presenting with weakness. The history is provided by the patient.  Weakness The primary symptoms include headaches. Primary symptoms do not include syncope, loss of consciousness, altered mental status, focal weakness, fever or vomiting. The episode lasted 1 hour. The symptoms are improving. The neurological symptoms are diffuse.  The headache is associated with weakness.   Additional symptoms include weakness.  She denies having trouble like this before although she does have trouble with "low blood sugar" at times when she does not eat.  She never has been treated in a hospital for this issue.  Her blood sugar was noted to be in the 80s by EMS.  Past Medical History  Diagnosis Date  . Anxiety   . Unstable angina   . Exertional chest pain   . Depression   . Dizziness   . DOE (dyspnea on exertion)   . Ringing in ears   . H/O heartburn   . Infections of kidney     h/o  . Knee pain, left   . Weakness generalized   . DJD (degenerative joint disease)     Past Surgical History  Procedure Date  . Cardiac catheterization   . Abdominal hysterectomy   . Cervical fusion   . Cystoscopy 10/13/10    no abnormality of bladder  . Transurethral resection of bladder tumor 10/16/02    Family History  Problem Relation Age of Onset  . Cancer Mother     lung  . Kidney failure Father     History  Substance Use Topics  . Smoking status: Current Every Day Smoker -- 1.0  packs/day for 38 years    Types: Cigarettes  . Smokeless tobacco: Not on file  . Alcohol Use: Yes    OB History    Grav Para Term Preterm Abortions TAB SAB Ect Mult Living                  Review of Systems  Constitutional: Negative for fever.  HENT: Negative for neck pain.   Respiratory: Negative for shortness of breath.   Cardiovascular: Negative for chest pain, palpitations and syncope.  Gastrointestinal: Negative for vomiting, abdominal pain and blood in stool.  Genitourinary: Negative for dysuria.  Musculoskeletal: Negative for back pain.  Skin: Negative for rash.  Neurological: Positive for weakness and headaches. Negative for focal weakness and loss of consciousness.  Psychiatric/Behavioral: Negative for altered mental status.  All other systems reviewed and are negative.    Allergies  Sulfa antibiotics  Home Medications   Current Outpatient Rx  Name  Route  Sig  Dispense  Refill  . ALPRAZOLAM 0.5 MG PO TABS   Oral   Take 0.5 mg by mouth at bedtime.          Marland Kitchen HYDROCODONE-ACETAMINOPHEN 7.5-500 MG PO TABS   Oral   Take 1 tablet by mouth daily.          Marland Kitchen  LEVOTHYROXINE SODIUM 50 MCG PO TABS   Oral   Take 50 mcg by mouth daily.          Marland Kitchen PAROXETINE HCL 40 MG PO TABS   Oral   Take 40 mg by mouth daily.            BP 127/57  Pulse 65  Temp 97.8 F (36.6 C) (Oral)  Resp 18  SpO2 97%  Physical Exam  Nursing note and vitals reviewed. Constitutional: She is oriented to person, place, and time. She appears well-developed and well-nourished. No distress.  HENT:  Head: Normocephalic and atraumatic.  Right Ear: External ear normal.  Left Ear: External ear normal.  Mouth/Throat: Oropharynx is clear and moist.  Eyes: Conjunctivae normal are normal. Right eye exhibits no discharge. Left eye exhibits no discharge. No scleral icterus.  Neck: Neck supple. No tracheal deviation present.  Cardiovascular: Normal rate, regular rhythm and intact distal  pulses.   Pulmonary/Chest: Effort normal and breath sounds normal. No stridor. No respiratory distress. She has no wheezes. She has no rales.  Abdominal: Soft. Bowel sounds are normal. She exhibits no distension. There is no tenderness. There is no rebound and no guarding.  Musculoskeletal: She exhibits no edema and no tenderness.  Neurological: She is alert and oriented to person, place, and time. She has normal strength. No cranial nerve deficit ( no gross defecits noted) or sensory deficit. She exhibits normal muscle tone. She displays no seizure activity. Coordination normal.       No pronator drift bilateral upper extrem, able to hold both legs off bed for 5 seconds, sensation intact in all extremities, no visual field cuts, no left or right sided neglect  Skin: Skin is warm and dry. No rash noted.  Psychiatric: She has a normal mood and affect.    ED Course  Procedures (including critical care time)  Rate: 62  Rhythm: normal sinus rhythm  QRS Axis: normal  Intervals: atypical RBBB  ST/T Wave abnormalities: normal  Conduction Disutrbances:none  Narrative Interpretation: abnl  Old EKG Reviewed: qrs interval increased from 116 to 122 otherwise no sig change  Labs Reviewed  CBC WITH DIFFERENTIAL - Abnormal; Notable for the following:    WBC 10.7 (*)     Neutro Abs 8.0 (*)     All other components within normal limits  COMPREHENSIVE METABOLIC PANEL - Abnormal; Notable for the following:    Glucose, Bld 104 (*)     Total Bilirubin 0.2 (*)     GFR calc non Af Amer 74 (*)     GFR calc Af Amer 86 (*)     All other components within normal limits  URINALYSIS, ROUTINE W REFLEX MICROSCOPIC - Abnormal; Notable for the following:    Hgb urine dipstick MODERATE (*)     Leukocytes, UA SMALL (*)     All other components within normal limits  GLUCOSE, CAPILLARY  POCT I-STAT TROPONIN I  URINE MICROSCOPIC-ADD ON  URINE CULTURE   No results found.   1. Near syncope   2. UTI (lower  urinary tract infection)       MDM  Pt without complaints of chest pain or shortness of breath.  Asymptomatic now.  No focal neuro deficits.  Unclear as to the etiology of her near syncope but eval reassuring.  Questionable UTI.  Will dc home on oral abx.  Close follow up.  Precautions discussed        Celene Kras, MD 07/16/12 2011

## 2012-07-17 LAB — URINE CULTURE

## 2013-06-09 ENCOUNTER — Emergency Department (HOSPITAL_COMMUNITY)
Admission: EM | Admit: 2013-06-09 | Discharge: 2013-06-09 | Disposition: A | Discharge status: Home / Self Care | Payer: Worker's Compensation | Attending: Emergency Medicine | Admitting: Emergency Medicine

## 2013-06-09 ENCOUNTER — Encounter (HOSPITAL_COMMUNITY): Payer: Self-pay | Admitting: Emergency Medicine

## 2013-06-09 ENCOUNTER — Emergency Department (HOSPITAL_COMMUNITY): Payer: Worker's Compensation

## 2013-06-09 DIAGNOSIS — Z79899 Other long term (current) drug therapy: Secondary | ICD-10-CM | POA: Insufficient documentation

## 2013-06-09 DIAGNOSIS — Y9210 Unspecified residential institution as the place of occurrence of the external cause: Secondary | ICD-10-CM | POA: Insufficient documentation

## 2013-06-09 DIAGNOSIS — Y990 Civilian activity done for income or pay: Secondary | ICD-10-CM | POA: Insufficient documentation

## 2013-06-09 DIAGNOSIS — S39012A Strain of muscle, fascia and tendon of lower back, initial encounter: Secondary | ICD-10-CM

## 2013-06-09 DIAGNOSIS — X500XXA Overexertion from strenuous movement or load, initial encounter: Secondary | ICD-10-CM | POA: Insufficient documentation

## 2013-06-09 DIAGNOSIS — Y9389 Activity, other specified: Secondary | ICD-10-CM | POA: Insufficient documentation

## 2013-06-09 DIAGNOSIS — F3289 Other specified depressive episodes: Secondary | ICD-10-CM | POA: Insufficient documentation

## 2013-06-09 DIAGNOSIS — Z8739 Personal history of other diseases of the musculoskeletal system and connective tissue: Secondary | ICD-10-CM | POA: Insufficient documentation

## 2013-06-09 DIAGNOSIS — Z87448 Personal history of other diseases of urinary system: Secondary | ICD-10-CM | POA: Insufficient documentation

## 2013-06-09 DIAGNOSIS — F17200 Nicotine dependence, unspecified, uncomplicated: Secondary | ICD-10-CM | POA: Insufficient documentation

## 2013-06-09 DIAGNOSIS — F411 Generalized anxiety disorder: Secondary | ICD-10-CM | POA: Insufficient documentation

## 2013-06-09 DIAGNOSIS — F329 Major depressive disorder, single episode, unspecified: Secondary | ICD-10-CM | POA: Insufficient documentation

## 2013-06-09 DIAGNOSIS — S335XXA Sprain of ligaments of lumbar spine, initial encounter: Secondary | ICD-10-CM | POA: Insufficient documentation

## 2013-06-09 DIAGNOSIS — Z792 Long term (current) use of antibiotics: Secondary | ICD-10-CM | POA: Insufficient documentation

## 2013-06-09 MED ORDER — OXYCODONE HCL 5 MG PO TABA: 5.0000 mg | ORAL_TABLET | Freq: Four times a day (QID) | ORAL | Status: DC | PRN

## 2013-06-09 MED ORDER — HYDROCODONE-ACETAMINOPHEN 5-325 MG PO TABS
1.0000 | ORAL_TABLET | Freq: Once | ORAL | Status: AC
  Administered 2013-06-09: 1 via ORAL
  Filled 2013-06-09: qty 1

## 2013-06-09 MED ORDER — DIAZEPAM 5 MG PO TABS: 5.0000 mg | ORAL_TABLET | Freq: Two times a day (BID) | ORAL | Status: DC

## 2013-06-09 MED ORDER — IBUPROFEN 200 MG PO TABS
600.0000 mg | ORAL_TABLET | Freq: Once | ORAL | Status: AC
  Administered 2013-06-09: 600 mg via ORAL
  Filled 2013-06-09: qty 3

## 2013-06-09 NOTE — ED Provider Notes (Signed)
CSN: [id number]     Arrival date & time 06/09/13  1218 History   This chart was scribed for non-physician practitioner, Marlon Pel, working with Toy Baker, MD by Clydene Laming, ED Scribe. This patient was seen in room WTR5/WTR5 and the patient's care was started at 1:26 PM.   Chief Complaint  Patient presents with  . Back Pain    lower    The history is provided by the patient. No language interpreter was used.   HPI Comments: Meagan Lambert is a 62 y.o. female who presents to the Emergency Department complaining of sharp, shooting, lower back pain onset this morning,worse on the left side. 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 She had difficulty walking or standing up straight at the time but now is able to. She takes 3.5mg  Hydrocodone on a  Daily basis for chronic back pain. Pt denies any pain before this morning. Pt has a hx of spine surgery in 1990. Denies loss of bowel or urine control, denies inability to walk or feel her feet. Rates her pain as 9/10.  Past Medical History  Diagnosis Date  . Anxiety   . Unstable angina   . Exertional chest pain   . Depression   . Dizziness   . DOE (dyspnea on exertion)   . Ringing in ears   . H/O heartburn   . Infections of kidney     h/o  . Knee pain, left   . Weakness generalized   . DJD (degenerative joint disease)    Past Surgical History  Procedure Laterality Date  . Cardiac catheterization    . Abdominal hysterectomy    . Cervical fusion    . Cystoscopy  10/13/10    no abnormality of bladder  . Transurethral resection of bladder tumor  10/16/02   Family History  Problem Relation Age of Onset  . Cancer Mother     lung  . Kidney failure Father    History  Substance Use Topics  . Smoking status: Current Every Day Smoker -- 1.00 packs/day for 38 years    Types:  Cigarettes  . Smokeless tobacco: Not on file  . Alcohol Use: Yes   OB History   Grav Para Term Preterm Abortions TAB SAB Ect Mult Living                 Review of Systems  Constitutional: Negative for fever and chills.  Musculoskeletal: Positive for back pain. Negative for joint swelling.    Allergies  Sulfa antibiotics  Home Medications   Current Outpatient Rx  Name  Route  Sig  Dispense  Refill  . ALPRAZolam (XANAX) 0.5 MG tablet   Oral   Take 0.5 mg by mouth at bedtime.          . calcium carbonate (TUMS - DOSED IN MG ELEMENTAL CALCIUM) 500 MG chewable tablet   Oral   Chew 1 tablet by mouth daily.         Marland Kitchen HYDROcodone-acetaminophen (LORTAB) 7.5-500 MG per tablet   Oral   Take 1 tablet by mouth daily.          Marland Kitchen levothyroxine (SYNTHROID, LEVOTHROID) 50 MCG tablet   Oral   Take 50 mcg by mouth daily.          Marland Kitchen PARoxetine (PAXIL)  40 MG tablet   Oral   Take 40 mg by mouth daily.          . cephALEXin (KEFLEX) 500 MG capsule   Oral   Take 1 capsule (500 mg total) by mouth 4 (four) times daily.   20 capsule   0   . diazepam (VALIUM) 5 MG tablet   Oral   Take 1 tablet (5 mg total) by mouth 2 (two) times daily.   10 tablet   0   . OxyCODONE HCl, Abuse Deter, 5 MG TABA   Oral   Take 5-10 mg by mouth every 6 (six) hours as needed.   20 tablet   0    Triage Vitals:BP 129/71  Pulse 59  Temp(Src) 98.4 F (36.9 C) (Oral)  Resp 18  SpO2 99% Physical Exam  Nursing note and vitals reviewed. Constitutional: She is oriented to person, place, and time. She appears well-developed and well-nourished. No distress.  HENT:  Head: Normocephalic and atraumatic.  Eyes: EOM are normal.  Neck: Neck supple. No tracheal deviation present.  Cardiovascular: Normal rate.   Pulmonary/Chest: Effort normal. No respiratory distress.  Musculoskeletal: Normal range of motion.       Back:   Equal strength to bilateral lower extremities. Neurosensory function  adequate to both legs. Skin color is normal. Skin is warm and moist. I see no step off deformity, no bony tenderness. Pt is able to ambulate without limp. Pain is relieved when sitting in certain positions. ROM is decreased due to pain. No crepitus, laceration, effusion, swelling.  Pulses are normal   Neurological: She is alert and oriented to person, place, and time.  Skin: Skin is warm and dry.  Psychiatric: She has a normal mood and affect. Her behavior is normal.    ED Course  Procedures (including critical care time) DIAGNOSTIC STUDIES: Oxygen Saturation is 99% on RA, normal by my interpretation.    COORDINATION OF CARE: 1:27 PM- Discussed treatment plan with pt at bedside.Lumbar x-ray ordered and pt will be given 600 mg ibuprofen and Vicodin.  Pt verbalized understanding and agreement with plan.   Labs Review Labs Reviewed - No data to display Imaging Review Dg Lumbar Spine Complete  06/09/2013   CLINICAL DATA:  Low back pain.  EXAM: LUMBAR SPINE - COMPLETE 4+ VIEW  COMPARISON:  CT of the abdomen and pelvis 03/29/2010.  FINDINGS: Five views of the lumbar spine demonstrate no definite acute displaced fracture or compression type fracture. Alignment is anatomic. Mild multilevel degenerative disc disease, most severe at L2-L3. Multilevel facet arthropathy, most severe at L5-S1. No definite defects of the pars interarticularis are noted.  IMPRESSION: 1. No acute radiographic abnormality of the lumbar spine to account for the patient's symptoms. 2. Mild multilevel degenerative disc disease and lumbar spondylosis, as above.   Electronically Signed   By: Trudie Reed M.D.   On: 06/09/2013 14:20    EKG Interpretation   None       MDM   1. 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 No neurological deficits and normal neuro exam. Patient can walk but states is painful. No loss of bowel or bladder control. No concern for cauda equina. No fever, night  sweats, weight loss, h/o cancer, IVDU. RICE protocol and pain medicine indicated and discussed with patient.   Patient Plan 1. Medications: narcotic pain medication, muscle relaxer and usual home medications  2. Treatment: rest, drink plenty  of fluids, gentle stretching as discussed, alternate ice and heat  3. Follow Up: Please followup with your primary doctor for discussion of your diagnoses and further evaluation after today's visit; if you do not have a primary care doctor use the resource guide provided to find one   Vital signs are stable at discharge. Filed Vitals:   06/09/13 1512  BP: 137/79  Pulse: 66  Temp:   Resp: 17    Patient/guardian has voiced understanding and agreed to follow-up with the PCP or specialist.      I personally performed the services described in this documentation, which was scribed in my presence. The recorded information has been reviewed and is accurate.    Dorthula Matas, PA-C 06/09/13 1827

## 2013-06-09 NOTE — ED Notes (Signed)
Pt states she will get her husband to come get her since I am giving her Vicodin for pain.  Pt informed she cannot drive within 4 -6 hours of receiving narcotic pain relievers.  Pt acknowledged this and states she will call her husband to come pick her up from ED.

## 2013-06-09 NOTE — ED Notes (Signed)
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  This morning the patient assisted another staff person in repositioning a resident and as they pulled the resident up in bed, patient experienced a sharp/shooting pain in the middle of her low back - worse left side.  Pt states she has numbness/tingling in left leg to her knee.  Pt denies N/V and loss of bowel or bladder control.

## 2013-06-09 NOTE — ED Notes (Signed)
Ice applied to lumbar spine.

## 2013-06-09 NOTE — ED Notes (Signed)
See triage note for nursing assessment.

## 2013-06-14 NOTE — ED Provider Notes (Signed)
Medical screening examination/treatment/procedure(s) were performed by non-physician practitioner and as supervising physician I was immediately available for consultation/collaboration.  Toy Baker, MD 06/14/13 [phone]

## 2014-10-10 ENCOUNTER — Institutional Professional Consult (permissible substitution): Payer: Self-pay | Admitting: Internal Medicine

## 2016-12-15 ENCOUNTER — Ambulatory Visit (INDEPENDENT_AMBULATORY_CARE_PROVIDER_SITE_OTHER): Payer: BLUE CROSS/BLUE SHIELD | Admitting: Orthopaedic Surgery

## 2016-12-15 ENCOUNTER — Encounter (INDEPENDENT_AMBULATORY_CARE_PROVIDER_SITE_OTHER): Payer: Self-pay | Admitting: Orthopaedic Surgery

## 2016-12-15 ENCOUNTER — Encounter (INDEPENDENT_AMBULATORY_CARE_PROVIDER_SITE_OTHER): Payer: Self-pay

## 2016-12-15 DIAGNOSIS — M65311 Trigger thumb, right thumb: Secondary | ICD-10-CM

## 2016-12-15 DIAGNOSIS — M1812 Unilateral primary osteoarthritis of first carpometacarpal joint, left hand: Secondary | ICD-10-CM

## 2016-12-17 DIAGNOSIS — M65311 Trigger thumb, right thumb: Secondary | ICD-10-CM

## 2016-12-17 DIAGNOSIS — M1812 Unilateral primary osteoarthritis of first carpometacarpal joint, left hand: Secondary | ICD-10-CM

## 2016-12-17 MED ORDER — METHYLPREDNISOLONE ACETATE 40 MG/ML IJ SUSP
13.3300 mg | INTRAMUSCULAR | Status: AC | PRN
  Administered 2016-12-17: 13.33 mg

## 2016-12-17 MED ORDER — BUPIVACAINE HCL 0.5 % IJ SOLN
0.3300 mL | INTRAMUSCULAR | Status: AC | PRN
  Administered 2016-12-17: .33 mL

## 2016-12-17 MED ORDER — LIDOCAINE HCL 1 % IJ SOLN
0.3000 mL | INTRAMUSCULAR | Status: AC | PRN
  Administered 2016-12-17: .3 mL

## 2016-12-17 NOTE — Progress Notes (Signed)
Office Visit Note   Patient: Meagan Lambert           Date of Birth: 1950-08-30           MRN: [id number] Visit Date: 12/15/2016              Requested by: No referring provider defined for this encounter. PCP: Herb Grays, MD (Inactive)   Assessment & Plan: Visit Diagnoses:  1. Trigger thumb, right thumb   2. Arthritis of carpometacarpal (CMC) joint of left thumb     Plan: Right trigger thumb and left thumb basal joint were injected today under sterile conditions patient tolerates this well.  Follow up with me as needed.  Follow-Up Instructions: Return if symptoms worsen or fail to improve.   Orders:  No orders of the defined types were placed in this encounter.  No orders of the defined types were placed in this encounter.     Procedures: Hand/UE Inj Date/Time: 12/17/2016 12:39 PM Performed by: Tarry Kos Authorized by: Tarry Kos   Consent Given by:  Patient Timeout: prior to procedure the correct patient, procedure, and site was verified   Indications:  Pain Condition: osteoarthritis   Location:  Thumb Thumb joint: CMC joint. Prep: patient was prepped and draped in usual sterile fashion   Needle Size:  25 G Medications:  0.3 mL lidocaine 1 %; 0.33 mL bupivacaine 0.5 %; 13.33 mg methylPREDNISolone acetate 40 MG/ML Hand/UE Inj Date/Time: 12/17/2016 12:39 PM Performed by: Tarry Kos Authorized by: Tarry Kos   Consent Given by:  Patient Timeout: prior to procedure the correct patient, procedure, and site was verified   Indications:  Pain Condition: trigger finger   Location:  Thumb (see office note for specific finger) Site:  R thumb A1 Prep: patient was prepped and draped in usual sterile fashion   Needle Size:  25 G Approach:  Volar Medications:  0.3 mL lidocaine 1 %; 0.33 mL bupivacaine 0.5 %; 13.33 mg methylPREDNISolone acetate 40 MG/ML     Clinical Data: No additional findings.   Subjective: Chief Complaint  Patient presents with    . Left Hand - Pain  . Right Hand - Pain    Ms. Meagan Lambert is a 66 year old female who I saw back in September for right trigger thumb and left thumb CMC arthritis. She had an injection back then and had good relief until recently. She comes in today requesting another injection in each hand. She denies any changes in medical history. Denies any new symptoms other than pain.    Review of Systems   Objective: Vital Signs: There were no vitals taken for this visit.  Physical Exam  Ortho Exam Bilateral hand exam is stable and unchanged. Specialty Comments:  No specialty comments available.  Imaging: No results found.   PMFS History: Patient Active Problem List   Diagnosis Date Noted  . Arthritis of carpometacarpal [facility]) joint of left thumb 12/17/2016  . Trigger thumb, right thumb 12/17/2016  . LBBB (left bundle branch block) 04/13/2012  . Chest pain 04/13/2012  . Anxiety   . Unstable angina (HCC)   . Exertional chest pain   . Depression   . Dizziness   . DOE (dyspnea on exertion)   . Ringing in ears   . H/O heartburn   . Knee pain, left   . Weakness generalized   . DJD (degenerative joint disease)    Past Medical History:  Diagnosis Date  . Anxiety   .  Depression   . Dizziness   . DJD (degenerative joint disease)   . DOE (dyspnea on exertion)   . Exertional chest pain   . H/O heartburn   . Infections of kidney    h/o  . Knee pain, left   . Ringing in ears   . Unstable angina (HCC)   . Weakness generalized     Family History  Problem Relation Age of Onset  . Cancer Mother     lung  . Kidney failure Father     Past Surgical History:  Procedure Laterality Date  . ABDOMINAL HYSTERECTOMY    . CARDIAC CATHETERIZATION    . CERVICAL FUSION    . CYSTOSCOPY  10/13/10   no abnormality of bladder  . TRANSURETHRAL RESECTION OF BLADDER TUMOR  10/16/02   Social History   Occupational History  . LPN     [employer]   Social History Main Topics  .  Smoking status: Current Every Day Smoker    Packs/day: 1.00    Years: 38.00    Types: Cigarettes  . Smokeless tobacco: Never Used  . Alcohol use Yes  . Drug use: No  . Sexual activity: Not on file

## 2017-06-02 ENCOUNTER — Other Ambulatory Visit: Payer: Self-pay | Admitting: Physician Assistant

## 2017-06-02 DIAGNOSIS — E2839 Other primary ovarian failure: Secondary | ICD-10-CM

## 2018-06-13 ENCOUNTER — Other Ambulatory Visit: Payer: Self-pay | Admitting: Physician Assistant

## 2018-06-13 DIAGNOSIS — E2839 Other primary ovarian failure: Secondary | ICD-10-CM

## 2018-06-27 ENCOUNTER — Institutional Professional Consult (permissible substitution): Payer: Self-pay | Admitting: Internal Medicine

## 2018-07-04 ENCOUNTER — Institutional Professional Consult (permissible substitution): Payer: Self-pay | Admitting: Internal Medicine

## 2018-08-17 ENCOUNTER — Ambulatory Visit
Admission: RE | Admit: 2018-08-17 | Discharge: 2018-08-17 | Disposition: A | Discharge status: Home / Self Care | Payer: Medicare Other | Source: Ambulatory Visit | Attending: Physician Assistant | Admitting: Physician Assistant

## 2018-08-17 DIAGNOSIS — E2839 Other primary ovarian failure: Secondary | ICD-10-CM

## 2018-09-04 ENCOUNTER — Encounter: Payer: Self-pay | Admitting: Internal Medicine

## 2018-09-04 ENCOUNTER — Ambulatory Visit: Payer: Medicare Other | Admitting: Internal Medicine

## 2018-09-04 VITALS — BP 110/70 | HR 61 | Ht 62.0 in | Wt 115.6 lb

## 2018-09-04 DIAGNOSIS — J449 Chronic obstructive pulmonary disease, unspecified: Secondary | ICD-10-CM

## 2018-09-04 DIAGNOSIS — F17210 Nicotine dependence, cigarettes, uncomplicated: Secondary | ICD-10-CM | POA: Diagnosis not present

## 2018-09-04 DIAGNOSIS — R0609 Other forms of dyspnea: Secondary | ICD-10-CM | POA: Diagnosis not present

## 2018-09-04 LAB — CBC WITH DIFFERENTIAL/PLATELET
Basophils Absolute: 0 10*3/uL (ref 0.0–0.1)
Basophils Relative: 0.4 % (ref 0.0–3.0)
Eosinophils Absolute: 0.1 10*3/uL (ref 0.0–0.7)
Eosinophils Relative: 1.6 % (ref 0.0–5.0)
HCT: 43.6 % (ref 36.0–46.0)
Hemoglobin: 14.5 g/dL (ref 12.0–15.0)
Lymphocytes Relative: 30.3 % (ref 12.0–46.0)
Lymphs Abs: 2.8 10*3/uL (ref 0.7–4.0)
MCHC: 33.3 g/dL (ref 30.0–36.0)
MCV: 90.8 fl (ref 78.0–100.0)
Monocytes Absolute: 0.6 10*3/uL (ref 0.1–1.0)
Monocytes Relative: 6.7 % (ref 3.0–12.0)
Neutro Abs: 5.6 10*3/uL (ref 1.4–7.7)
Neutrophils Relative %: 61 % (ref 43.0–77.0)
Platelets: 291 10*3/uL (ref 150.0–400.0)
RBC: 4.8 Mil/uL (ref 3.87–5.11)
RDW: 13.9 % (ref 11.5–15.5)
WBC: 9.1 10*3/uL (ref 4.0–10.5)

## 2018-09-04 MED ORDER — MOMETASONE FURO-FORMOTEROL FUM 200-5 MCG/ACT IN AERO: 2.0000 | INHALATION_SPRAY | Freq: Two times a day (BID) | RESPIRATORY_TRACT | 0 refills | Status: DC

## 2018-09-04 MED ORDER — FLUTTER DEVI: 0 refills | Status: AC

## 2018-09-04 MED ORDER — MOMETASONE FURO-FORMOTEROL FUM 200-5 MCG/ACT IN AERO: 2.0000 | INHALATION_SPRAY | Freq: Two times a day (BID) | RESPIRATORY_TRACT | 11 refills | Status: DC

## 2018-09-04 NOTE — Patient Instructions (Signed)
Dulera 200 Take 2 puffs first thing in am and then another 2 puffs about 12 hours later.     Work on inhaler technique:  relax and gently blow all the way out then take a nice smooth deep breath back in, triggering the inhaler at same time you start breathing in.  Hold for up to 5 seconds if you can. Blow out thru nose. Rinse and gargle with water when done     The key is to stop smoking completely before smoking completely stops you!  For smoking cessation classes call [phone]    Please remember to go to the lab department   for your tests - we will call you with the results when they are available.      Please schedule a follow up office visit in 6 weeks, call sooner if needed with full pfts

## 2018-09-04 NOTE — Assessment & Plan Note (Addendum)
09/04/2018   Walked RA  2 laps @ 2108ft each @ fast  pace  stopped due to min sob/ no desat   See copd / no need for 02

## 2018-09-04 NOTE — Assessment & Plan Note (Addendum)
4-5 min discussion re active cigarette smoking in addition to office E&M  Ask about tobacco use:   ongoing Advise quitting   I emphasized that although we never turn away smokers from the pulmonary clinic, we do ask that they understand that the recommendations that we make  won't work nearly as well in the presence of continued cigarette exposure. In fact, we may very well  reach a point where we can't promise to help the patient if he/she can't quit smoking. (We can and will promise to try to help, we just can't promise what we recommend will really work)  Assess willingness:  Not quit yet committed at this point Assist in quit attempt:  Per PCP when ready Arrange follow up:   Follow up per Primary Care planned  For smoking cessation classes call [phone]     Total time devoted to counseling  > 50 % of initial 60 min office visit:  review case with pt/ device teaching which extended face to face time for this visit as did direct observation of amb 02 sat study/ discussion of options/alternatives/ personally creating written customized instructions  in presence of pt  then going over those specific  Instructions directly with the pt including how to use all of the meds but in particular covering each new medication in detail and the difference between the maintenance= "automatic" meds and the prns using an action plan format for the latter (If this problem/symptom => do that organization reading Left to right).  Please see AVS from this visit for a full list of these instructions which I personally wrote for this pt and  are unique to this visit.

## 2018-09-04 NOTE — Progress Notes (Addendum)
Meagan Lambert, female    DOB: Jan 26, 1951,    MRN: [id number]    Brief patient profile:  52 yowf  LPN active smoker previously very active including some jogging as adult but around 2018 noted decreased ex tol and stopped joggying and gradually worse to point where doe to driveway  Maybe 150 ft flat so referred to pulmonary clinic 09/04/2018 by Stephens Shire with GOLD II criteria on initial eval     History of Present Illness  09/04/2018  Pulmonary/ 1st office eval/Tierney Behl  Chief Complaint  Patient presents with  . Pulmonary Consult    Referred by Stephens Shire PA. Pt c/o cough and SOB x 6 months. She is coughing up clear sputum. She gets SOB walking to the end of her driveway. She has an atrovent inhaler she uses approx 2 x per wk.   Dyspnea:  150 ft and stops Cough: clear mucus esp in am's and hs but hard time coughing it up  Sleep:  Takes xanax 2 pillows/bed flat SABA use: atovent hfa / doesn't really help   No obvious day to day or daytime variability or assoc excess/ purulent sputum or mucus plugs or hemoptysis or cp or chest tightness, subjective wheeze or overt sinus or hb symptoms.   Sleeping flat without nocturnal   exacerbation  of respiratory  c/o's or need for noct saba. Also denies any obvious fluctuation of symptoms with weather or environmental changes or other aggravating or alleviating factors except as outlined above   No unusual exposure hx or h/o childhood pna/ asthma or knowledge of premature birth.  Current Allergies, Complete Past Medical History, Past Surgical History, Family History, and Social History were reviewed in [employer] record.  ROS  The following are not active complaints unless bolded Hoarseness, sore throat, dysphagia, dental problems, itching, sneezing,  nasal congestion or discharge of excess mucus or purulent secretions, ear ache,   fever, chills, sweats, unintended wt loss or wt gain, classically pleuritic or exertional cp,   orthopnea pnd or arm/hand swelling  or leg swelling, presyncope, palpitations, abdominal pain, anorexia, nausea, vomiting, diarrhea  or change in bowel habits or change in bladder habits, change in stools or change in urine, dysuria, hematuria,  rash, arthralgias, visual complaints, headache, numbness, weakness or ataxia or problems with walking or coordination,  change in mood or  memory.            Past Medical History:  Diagnosis Date  . Anxiety   . Depression   . Dizziness   . DJD (degenerative joint disease)   . DOE (dyspnea on exertion)   . Exertional chest pain   . H/O heartburn   . Infections of kidney    h/o  . Knee pain, left   . Ringing in ears   . Unstable angina (HCC)   . Weakness generalized     Outpatient Medications Prior to Visit  Medication Sig Dispense Refill  . acetaminophen (TYLENOL) 325 MG tablet Take 650 mg by mouth every 6 (six) hours as needed for headache.    . ALPRAZolam (XANAX) 0.5 MG tablet Take 0.5 mg by mouth at bedtime.     . calcium carbonate (TUMS - DOSED IN MG ELEMENTAL CALCIUM) 500 MG chewable tablet Chew 1 tablet by mouth daily.    Marland Kitchen HYDROcodone-acetaminophen (LORTAB) 7.5-500 MG per tablet Take 1 tablet by mouth daily.     Marland Kitchen ipratropium (ATROVENT HFA) 17 MCG/ACT inhaler Inhale 2 puffs into the lungs every  6 (six) hours as needed for wheezing.    Marland Kitchen. PARoxetine (PAXIL) 20 MG tablet Take 20 mg by mouth daily.    Marland Kitchen. PARoxetine (PAXIL) 40 MG tablet Take 20 mg by mouth daily.     . cephALEXin (KEFLEX) 500 MG capsule Take 1 capsule (500 mg total) by mouth 4 (four) times daily. (Patient not taking: Reported on 12/15/2016) 20 capsule 0  . diazepam (VALIUM) 5 MG tablet Take 1 tablet (5 mg total) by mouth 2 (two) times daily. (Patient not taking: Reported on 12/15/2016) 10 tablet 0  . levothyroxine (SYNTHROID, LEVOTHROID) 50 MCG tablet Take 50 mcg by mouth daily.     . OxyCODONE HCl, Abuse Deter, 5 MG TABA Take 5-10 mg by mouth every 6 (six) hours as needed.  (Patient not taking: Reported on 12/15/2016) 20 tablet 0   No facility-administered medications prior to visit.      Objective:     BP 110/70 (BP Location: Left Arm, Cuff Size: Normal)   Pulse 61   Ht 5\' 2"  (1.575 m)   Wt 115 lb 9.6 oz (52.4 kg)   SpO2 95%   BMI 21.14 kg/m   SpO2: 95 % RA  amb wf rattling very congested sounded cough   HEENT: nl dentition / oropharynx. Nl external ear canals without cough reflex -  Mild bilateral non-specific turbinate edema     NECK :  without JVD/Nodes/TM/ nl carotid upstrokes bilaterally   LUNGS: no acc muscle use,  Mod barrel  contour chest wall with bilateral  Distant bs s audible wheeze and  without cough on insp or exp maneuver and mod  Hyperresonant  to  percussion bilaterally     CV:  RRR  no s3 or murmur or increase in P2, and no edema   ABD:  soft and nontender with pos mid insp Hoover's  in the supine position. No bruits or organomegaly appreciated, bowel sounds nl  MS:   Nl gait/  ext warm without deformities, calf tenderness, cyanosis or clubbing No obvious joint restrictions   SKIN: warm and dry without lesions    NEURO:  alert, approp, nl sensorium with  no motor or cerebellar deficits apparent.         I personally reviewed images and agree with radiology impression as follows:   Chest CT LDSCT  04/26/18   Lungs and bronchi: Moderate centrilobular emphysema with bronchial wall thickening. Minimal biapical pleural parenchymal scarring. Mild dependent atelectasis in both lower lobe bases. Small fissural node left major fissure series 2 image 231. Tiny  scattered calcified granulomas. Focal atelectasis left lower lobe, image 327. No consolidation     Assessment   COPD GOLD II/ active smoker  Active smoker with prominent CB features  - Spirometry 09/04/2018  FEV1 1.2 (57%)  Ratio 52 with classic curvature  - 09/04/2018  After extensive coaching inhaler device,  effectiveness = 75% > try dulera 200 2bid  - flutter  valve training  09/04/2018     DDX of  difficult airways management almost all start with A and  include Adherence, Ace Inhibitors, Acid Reflux, Active Sinus Disease, Alpha 1 Antitripsin deficiency, Anxiety masquerading as Airways dz,  ABPA,  Allergy(esp in young), Aspiration (esp in elderly), Adverse effects of meds,  Active smoking or vaping, A bunch of PE's (a small clot burden can't cause this syndrome unless there is already severe underlying pulm or vascular dz with poor reserve) plus two Bs  = Bronchiectasis and Beta blocker use..and one  C= CHF     Adherence is always the initial "prime suspect" and is a multilayered concern that requires a "trust but verify" approach in every patient - starting with knowing how to use medications, especially inhalers, correctly, keeping up with refills and understanding the fundamental difference between maintenance and prns vs those medications only taken for a very short course and then stopped and not refilled.  - see hfa teaching  - return with all meds in hand using a trust but verify approach to confirm accurate Medication  Reconciliation The principal here is that until we are certain that the  patients are doing what we've asked, it makes no sense to ask them to do more.    Active smoking top of the list of suspects  ?allergy/ asthmatic component > check eos/  try high dose ICS  ? Alpha one def > send screen  ? Bronchiectasis > not present on LDSCT but need hrct to r/o, in meantime try flutter valve/ mucinex for clearance     Advised:  formulary restrictions will be an ongoing challenge for the forseable future and I would be happy to pick an alternative if the pt will first  provide me a list of them -  pt  will need to return here for training for any new device that is required eg dpi vs hfa vs respimat.    In the meantime we can always provide samples so that the patient never runs out of any needed respiratory medications.   F/u with full  pfts in 6 weeks     DOE (dyspnea on exertion) 09/04/2018   Walked RA  2 laps @ 243ft each @ fast  pace  stopped due to min sob/ no desat   See copd / no need for 02     Cigarette smoker 4-5 min discussion re active cigarette smoking in addition to office E&M  Ask about tobacco use:   ongoing Advise quitting   I emphasized that although we never turn away smokers from the pulmonary clinic, we do ask that they understand that the recommendations that we make  won't work nearly as well in the presence of continued cigarette exposure. In fact, we may very well  reach a point where we can't promise to help the patient if he/she can't quit smoking. (We can and will promise to try to help, we just can't promise what we recommend will really work)  Assess willingness:  Not quit yet committed at this point Assist in quit attempt:  Per PCP when ready Arrange follow up:   Follow up per Primary Care planned  For smoking cessation classes call [phone]          Total time devoted to counseling  > 50 % of initial 60 min office visit:  review case with pt/ device teaching which extended face to face time for this visit as did direct observation of amb 02 sat study/ discussion of options/alternatives/ personally creating written customized instructions  in presence of pt  then going over those specific  Instructions directly with the pt including how to use all of the meds but in particular covering each new medication in detail and the difference between the maintenance= "automatic" meds and the prns using an action plan format for the latter (If this problem/symptom => do that organization reading Left to right).  Please see AVS from this visit for a full list of these instructions which I personally wrote for this pt and  are unique to this visit.          Sandrea Hughs, MD 09/04/2018

## 2018-09-04 NOTE — Assessment & Plan Note (Addendum)
Active smoker with prominent CB features  - Spirometry 09/04/2018  FEV1 1.2 (57%)  Ratio 52 with classic curvature  - 09/04/2018  After extensive coaching inhaler device,  effectiveness = 75% > try dulera 200 2bid  - flutter valve training  09/04/2018     DDX of  difficult airways management almost all start with A and  include Adherence, Ace Inhibitors, Acid Reflux, Active Sinus Disease, Alpha 1 Antitripsin deficiency, Anxiety masquerading as Airways dz,  ABPA,  Allergy(esp in young), Aspiration (esp in elderly), Adverse effects of meds,  Active smoking or vaping, A bunch of PE's (a small clot burden can't cause this syndrome unless there is already severe underlying pulm or vascular dz with poor reserve) plus two Bs  = Bronchiectasis and Beta blocker use..and one C= CHF     Adherence is always the initial "prime suspect" and is a multilayered concern that requires a "trust but verify" approach in every patient - starting with knowing how to use medications, especially inhalers, correctly, keeping up with refills and understanding the fundamental difference between maintenance and prns vs those medications only taken for a very short course and then stopped and not refilled.  - see hfa teaching  - return with all meds in hand using a trust but verify approach to confirm accurate Medication  Reconciliation The principal here is that until we are certain that the  patients are doing what we've asked, it makes no sense to ask them to do more.    Active smoking top of the list of suspects  ?allergy/ asthmatic component > check eos/  try high dose ICS  ? Alpha one def > send screen  ? Bronchiectasis > not present on LDSCT but need hrct to r/o, in meantime try flutter valve/ mucinex for clearance     Advised:  formulary restrictions will be an ongoing challenge for the forseable future and I would be happy to pick an alternative if the pt will first  provide me a list of them -  pt  will need to return  here for training for any new device that is required eg dpi vs hfa vs respimat.    In the meantime we can always provide samples so that the patient never runs out of any needed respiratory medications.    F/u with full pfts in 6 weeks

## 2018-09-08 LAB — ALPHA-1 ANTITRYPSIN PHENOTYPE: A-1 Antitrypsin, Ser: 129 mg/dL (ref 83–199)

## 2018-09-10 NOTE — Progress Notes (Signed)
ATC, NA and no option to leave msg 

## 2018-09-11 NOTE — Progress Notes (Signed)
LMTCB

## 2018-09-12 NOTE — Progress Notes (Signed)
Spoke with pt and notified of results per Dr. Wert. Pt verbalized understanding and denied any questions. 

## 2018-10-16 ENCOUNTER — Ambulatory Visit: Payer: Self-pay | Admitting: Internal Medicine

## 2019-07-24 ENCOUNTER — Other Ambulatory Visit: Payer: Self-pay | Admitting: Physician Assistant

## 2019-07-24 DIAGNOSIS — Z1231 Encounter for screening mammogram for malignant neoplasm of breast: Secondary | ICD-10-CM

## 2019-08-28 ENCOUNTER — Encounter: Payer: Self-pay | Admitting: Physician Assistant

## 2019-08-28 ENCOUNTER — Ambulatory Visit: Payer: Self-pay

## 2019-08-28 ENCOUNTER — Ambulatory Visit: Payer: Medicare Other | Admitting: Orthopaedic Surgery

## 2019-08-28 ENCOUNTER — Other Ambulatory Visit: Payer: Self-pay

## 2019-08-28 DIAGNOSIS — M25541 Pain in joints of right hand: Secondary | ICD-10-CM | POA: Diagnosis not present

## 2019-08-28 DIAGNOSIS — M79642 Pain in left hand: Secondary | ICD-10-CM

## 2019-08-28 DIAGNOSIS — M1812 Unilateral primary osteoarthritis of first carpometacarpal joint, left hand: Secondary | ICD-10-CM | POA: Diagnosis not present

## 2019-08-28 DIAGNOSIS — M25542 Pain in joints of left hand: Secondary | ICD-10-CM

## 2019-08-28 DIAGNOSIS — M65331 Trigger finger, right middle finger: Secondary | ICD-10-CM

## 2019-08-28 DIAGNOSIS — M65311 Trigger thumb, right thumb: Secondary | ICD-10-CM

## 2019-08-28 DIAGNOSIS — M79641 Pain in right hand: Secondary | ICD-10-CM

## 2019-08-28 MED ORDER — METHYLPREDNISOLONE ACETATE 40 MG/ML IJ SUSP
13.3300 mg | INTRAMUSCULAR | Status: AC | PRN
  Administered 2019-08-28: 13.33 mg

## 2019-08-28 MED ORDER — LIDOCAINE HCL 1 % IJ SOLN
1.0000 mL | INTRAMUSCULAR | Status: AC | PRN
  Administered 2019-08-28: 1 mL

## 2019-08-28 MED ORDER — LIDOCAINE HCL 1 % IJ SOLN
3.0000 mL | INTRAMUSCULAR | Status: AC | PRN
  Administered 2019-08-28: 3 mL

## 2019-08-28 MED ORDER — BUPIVACAINE HCL 0.25 % IJ SOLN
0.3300 mL | INTRAMUSCULAR | Status: AC | PRN
  Administered 2019-08-28: .33 mL

## 2019-08-28 NOTE — Progress Notes (Signed)
Office Visit Note   Patient: Meagan Lambert           Date of Birth: Feb 13, 1951           MRN: [id number] Visit Date: 08/28/2019              Requested by: Chesley Noon, MD [place],  [place] [id number] PCP: Chesley Noon, MD   Assessment & Plan: Visit Diagnoses:  1. Bilateral hand pain   2. Trigger finger, right middle finger   3. Trigger finger of right thumb   4. Arthritis of carpometacarpal (CMC) joint of left thumb     Plan: Impression is left thumb first CMC joint osteoarthritis.  Right thumb and long trigger fingers.  Possible early arthritis to the right first [facility] joint.  Today, we injected the left thumb CMC joint, right thumb and long trigger fingers.  If she is still having pain to the right thumb a few weeks from now, she may come in for right thumb CMC joint injection.  Otherwise, follow-up with [place] as needed.  Follow-Up Instructions: Return if symptoms worsen or fail to improve.   Orders:  Orders Placed This Encounter  Procedures  . Hand/UE Inj: R long A1  . Hand/UE Inj: R thumb A1  . Hand/UE Inj: L thumb CMC  . XR Hand Complete Left  . XR Hand Complete Right   No orders of the defined types were placed in this encounter.     Procedures: Hand/UE Inj: R long A1 for trigger finger on 08/28/2019 3:54 PM Indications: pain Details: 25 G needle Medications: 1 mL lidocaine 1 %; 0.33 mL bupivacaine 0.25 %; 13.33 mg methylPREDNISolone acetate 40 MG/ML  Hand/UE Inj: R thumb A1 for trigger finger on 08/28/2019 3:55 PM Indications: pain Details: 25 G needle Medications: 1 mL lidocaine 1 %; 0.33 mL bupivacaine 0.25 %; 13.33 mg methylPREDNISolone acetate 40 MG/ML  Hand/UE Inj: L thumb CMC for osteoarthritis on 08/28/2019 3:55 PM Medications: 3 mL lidocaine 1 %; 0.33 mL bupivacaine 0.25 %; 13.33 mg methylPREDNISolone acetate 40 MG/ML      Clinical Data: No additional findings.   Subjective: Chief Complaint  Patient presents with  .  Right Hand - Pain  . Left Hand - Pain    HPI patient is a pleasant 68 year old female who presents our clinic today with bilateral hand pain.  The pain she has is to the entire aspect of both hands but worse to the first [facility] joint on the left hand.  She notes triggering to the right thumb and long fingers.  She was seen in our office about 2 years ago where the left thumb CMC joint and right trigger finger were injected with cortisone.  She notes moderate improvement of symptoms until recently.  Review of Systems as detailed in HPI.  All others reviewed and are negative.   Objective: Vital Signs: There were no vitals taken for this visit.  Physical Exam well-developed well-nourished female no acute distress.  Alert oriented x3.  Ortho Exam examination of both hands reveals nodules to the PIP and DIP joints.  She does have moderate tenderness to the first [facility] joint with a positive grind test.  Negative grind test to the right first [facility] joint.  No tenderness to the first dorsal compartment either side.  She has a tender and palpable nodule to the right thumb A1 pulley.  Mild tenderness without much of a palpable nodule to the right long finger  A1 pulley.  No active triggering.  She is neurovascularly intact distally.  Specialty Comments:  No specialty comments available.  Imaging: XR Hand Complete Left  Result Date: 08/28/2019 Marked degenerative changes to the first [facility] joint  XR Hand Complete Right  Result Date: 08/28/2019 Mild degenerative changes the first [facility] joint    PMFS History: Patient Active Problem List   Diagnosis Date Noted  . COPD GOLD II/ active smoker  09/04/2018  . Cigarette smoker 09/04/2018  . Arthritis of carpometacarpal [facility]) joint of left thumb 12/17/2016  . Trigger thumb, right thumb 12/17/2016  . LBBB (left bundle branch block) 04/13/2012  . Chest pain 04/13/2012  . Anxiety   . Unstable angina (HCC)   . Exertional chest pain   . Depression   .  Dizziness   . DOE (dyspnea on exertion)   . Ringing in ears   . H/O heartburn   . Knee pain, left   . Weakness generalized   . DJD (degenerative joint disease)    Past Medical History:  Diagnosis Date  . Anxiety   . Depression   . Dizziness   . DJD (degenerative joint disease)   . DOE (dyspnea on exertion)   . Exertional chest pain   . H/O heartburn   . Infections of kidney    h/o  . Knee pain, left   . Ringing in ears   . Unstable angina (HCC)   . Weakness generalized     Family History  Problem Relation Age of Onset  . Cancer Mother        lung  . Kidney failure Father     Past Surgical History:  Procedure Laterality Date  . ABDOMINAL HYSTERECTOMY    . CARDIAC CATHETERIZATION    . CERVICAL FUSION    . CYSTOSCOPY  10/13/10   no abnormality of bladder  . TRANSURETHRAL RESECTION OF BLADDER TUMOR  10/16/02   Social History   Occupational History  . Occupation: LPN    Comment: Therapist, occupational  Tobacco Use  . Smoking status: Current Every Day Smoker    Packs/day: 1.00    Years: 38.00    Pack years: 38.00    Types: Cigarettes  . Smokeless tobacco: Never Used  Substance and Sexual Activity  . Alcohol use: Yes  . Drug use: No  . Sexual activity: Not on file

## 2019-08-29 ENCOUNTER — Telehealth: Payer: Self-pay | Admitting: Orthopaedic Surgery

## 2019-08-29 NOTE — Telephone Encounter (Signed)
Patient called and requesting a refill on Nobic medication. Patient asked for medication to be the sent to the last pharmacy used. Patient phone number is [phone].

## 2019-09-02 ENCOUNTER — Other Ambulatory Visit: Payer: Self-pay | Admitting: Physician Assistant

## 2019-09-02 MED ORDER — MELOXICAM 7.5 MG PO TABS: 7.5000 mg | ORAL_TABLET | Freq: Every day | ORAL | 2 refills | Status: AC

## 2019-09-02 NOTE — Telephone Encounter (Signed)
Sent in

## 2019-09-16 ENCOUNTER — Ambulatory Visit: Payer: Medicare Other | Admitting: Internal Medicine

## 2019-09-18 ENCOUNTER — Ambulatory Visit
Admission: RE | Admit: 2019-09-18 | Discharge: 2019-09-18 | Disposition: A | Discharge status: Home / Self Care | Payer: Medicare Other | Source: Ambulatory Visit | Attending: Physician Assistant | Admitting: Physician Assistant

## 2019-09-18 ENCOUNTER — Other Ambulatory Visit: Payer: Self-pay

## 2019-09-18 DIAGNOSIS — Z1231 Encounter for screening mammogram for malignant neoplasm of breast: Secondary | ICD-10-CM

## 2020-07-29 ENCOUNTER — Encounter (HOSPITAL_COMMUNITY): Payer: Self-pay

## 2020-07-29 ENCOUNTER — Emergency Department (HOSPITAL_COMMUNITY)
Admission: EM | Admit: 2020-07-29 | Discharge: 2020-07-30 | Disposition: A | Discharge status: Home / Self Care | Payer: Medicare Other | Attending: Emergency Medicine | Admitting: Emergency Medicine

## 2020-07-29 ENCOUNTER — Other Ambulatory Visit: Payer: Self-pay

## 2020-07-29 DIAGNOSIS — Z7951 Long term (current) use of inhaled steroids: Secondary | ICD-10-CM | POA: Insufficient documentation

## 2020-07-29 DIAGNOSIS — Z20822 Contact with and (suspected) exposure to covid-19: Secondary | ICD-10-CM | POA: Diagnosis not present

## 2020-07-29 DIAGNOSIS — J00 Acute nasopharyngitis [common cold]: Secondary | ICD-10-CM | POA: Diagnosis not present

## 2020-07-29 DIAGNOSIS — R0981 Nasal congestion: Secondary | ICD-10-CM | POA: Diagnosis present

## 2020-07-29 DIAGNOSIS — J449 Chronic obstructive pulmonary disease, unspecified: Secondary | ICD-10-CM | POA: Diagnosis not present

## 2020-07-29 DIAGNOSIS — F17210 Nicotine dependence, cigarettes, uncomplicated: Secondary | ICD-10-CM | POA: Insufficient documentation

## 2020-07-29 NOTE — ED Triage Notes (Signed)
Pt reports that she had dental surgery today, removed her top teeth and most of her bottom, since getting home reports that her nose has been running constantly.

## 2020-07-30 LAB — RESP PANEL BY RT-PCR (FLU A&B, COVID) ARPGX2
Influenza A by PCR: NEGATIVE
Influenza B by PCR: NEGATIVE
SARS Coronavirus 2 by RT PCR: NEGATIVE

## 2020-07-30 MED ORDER — FLUTICASONE PROPIONATE 50 MCG/ACT NA SUSP: 2.0000 | Freq: Every day | NASAL | 0 refills | Status: DC

## 2020-07-30 NOTE — ED Provider Notes (Signed)
[facility] EMERGENCY DEPARTMENT Provider Note   CSN: [id number] Arrival date & time: 07/29/20  2150     History Chief Complaint  Patient presents with  . Post-op Problem    Meagan Lambert is a 69 y.o. female.  HPI      Had multiple dental extractions yesterday and later in day developed severe nose running and burning in nose. Reports nose running it "feels like a faucet was turned on"-constant clear fluid.  No hx of trauma or other abnormalities.  No cough, fevers. Has not tried anything for it.  Has some ear discomfort on the left as if tehre is fluid present.     Past Medical History:  Diagnosis Date  . Anxiety   . Depression   . Dizziness   . DJD (degenerative joint disease)   . DOE (dyspnea on exertion)   . Exertional chest pain   . H/O heartburn   . Infections of kidney    h/o  . Knee pain, left   . Ringing in ears   . Unstable angina (HCC)   . Weakness generalized     Patient Active Problem List   Diagnosis Date Noted  . COPD GOLD II/ active smoker  09/04/2018  . Cigarette smoker 09/04/2018  . Arthritis of carpometacarpal [facility]) joint of left thumb 12/17/2016  . Trigger thumb, right thumb 12/17/2016  . LBBB (left bundle branch block) 04/13/2012  . Chest pain 04/13/2012  . Anxiety   . Unstable angina (HCC)   . Exertional chest pain   . Depression   . Dizziness   . DOE (dyspnea on exertion)   . Ringing in ears   . H/O heartburn   . Knee pain, left   . Weakness generalized   . DJD (degenerative joint disease)     Past Surgical History:  Procedure Laterality Date  . ABDOMINAL HYSTERECTOMY    . CARDIAC CATHETERIZATION    . CERVICAL FUSION    . CYSTOSCOPY  10/13/10   no abnormality of bladder  . TRANSURETHRAL RESECTION OF BLADDER TUMOR  10/16/02     OB History   No obstetric history on file.     Family History  Problem Relation Age of Onset  . Cancer Mother        lung  . Kidney failure Father     Social History    Tobacco Use  . Smoking status: Current Every Day Smoker    Packs/day: 1.00    Years: 38.00    Pack years: 38.00    Types: Cigarettes  . Smokeless tobacco: Never Used  Vaping Use  . Vaping Use: Never used  Substance Use Topics  . Alcohol use: Yes  . Drug use: No    Home Medications Prior to Admission medications   Medication Sig Start Date End Date Taking? Authorizing Provider  acetaminophen (TYLENOL) 325 MG tablet Take 650 mg by mouth every 6 (six) hours as needed for headache.    [provider]  ALPRAZolam Prudy Feeler) 0.5 MG tablet Take 0.5 mg by mouth at bedtime.  03/26/12   [provider]  calcium carbonate (TUMS - DOSED IN MG ELEMENTAL CALCIUM) 500 MG chewable tablet Chew 1 tablet by mouth daily.    [provider]  fluticasone (FLONASE) 50 MCG/ACT nasal spray Place 2 sprays into both nostrils daily for 5 days. 07/30/20 08/04/20  Alvira Monday, MD  HYDROcodone-acetaminophen (LORTAB) 7.5-500 MG per tablet Take 1 tablet by mouth daily.  04/02/12  [provider]  mometasone-formoterol (DULERA) 200-5 MCG/ACT AERO Inhale 2 puffs into the lungs every 12 (twelve) hours. 09/04/18   Nyoka Cowden, MD  PARoxetine (PAXIL) 20 MG tablet Take 20 mg by mouth daily.    [provider]  Respiratory Therapy Supplies (FLUTTER) DEVI Use as directed 09/04/18   Nyoka Cowden, MD    Allergies    Sulfa antibiotics  Review of Systems   Review of Systems  Constitutional: Negative for fever.  HENT: Positive for ear pain, postnasal drip and rhinorrhea. Negative for drooling. Dental problem: had extractions.   Gastrointestinal: Negative for nausea and vomiting.    Physical Exam Updated Vital Signs BP (!) 146/76 (BP Location: Right Arm)   Pulse 66   Temp 98.8 F (37.1 C) (Oral)   Resp 16   SpO2 96%   Physical Exam HENT:     Head:     Comments: Edematous nasal turbinates    ED Results / Procedures / Treatments   Labs (all labs ordered are  listed, but only abnormal results are displayed) Labs Reviewed  RESP PANEL BY RT-PCR (FLU A&B, COVID) ARPGX2    EKG None  Radiology No results found.  Procedures Procedures (including critical care time)  Medications Ordered in ED Medications - No data to display  ED Course  I have reviewed the triage vital signs and the nursing notes.  Pertinent labs & imaging results that were available during my care of the patient were reviewed by me and considered in my medical decision making (see chart for details).    MDM Rules/Calculators/A&P                          69yo with history above presents with concern for rhinorrhea after having dental extraction yesterday.  Do not have reason for or indication to suggest CSF leak.  Suspect likely rhinitis secondary to irritation from oxygen via nasal cannula during her procedure yesterday , possible viral etiology of environmental allergy.  Recommend flonase and supportive care. Patient discharged in stable condition with understanding of reasons to return.    Final Clinical Impression(s) / ED Diagnoses Final diagnoses:  Acute rhinitis    Rx / DC Orders ED Discharge Orders         Ordered    fluticasone (FLONASE) 50 MCG/ACT nasal spray  Daily        07/30/20 0759           Alvira Monday, MD 07/31/20 [phone]

## 2020-12-28 IMAGING — MG DIGITAL SCREENING BILAT W/ TOMO W/ CAD
8 series · 8 of 24 positions shown · non-contrast
Comparison: Previous exam(s).

CLINICAL DATA: Screening.

EXAM:
DIGITAL SCREENING BILATERAL MAMMOGRAM WITH TOMO AND CAD

[L MLO synth-2D]
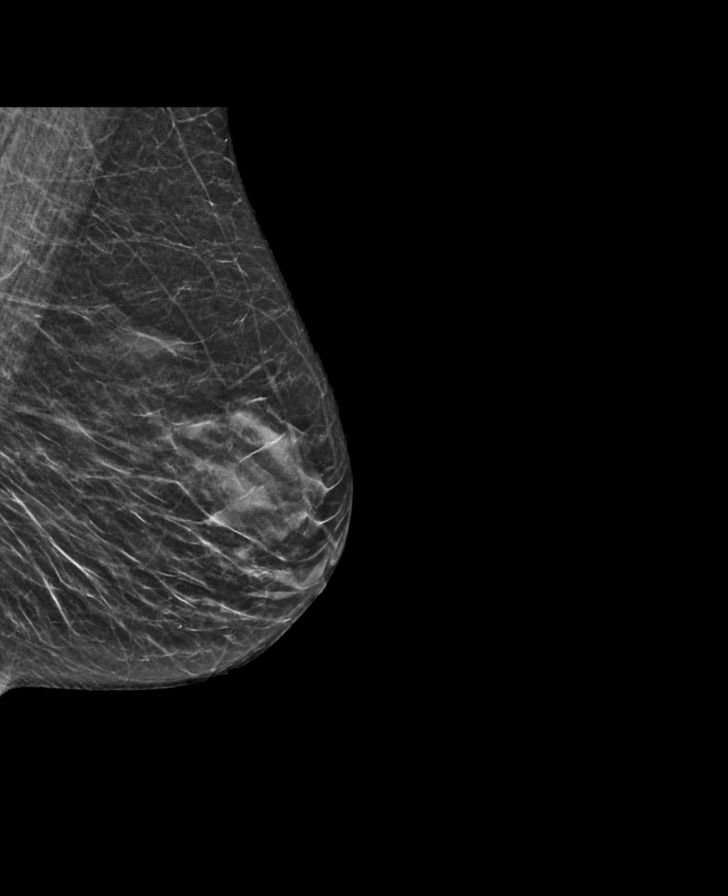

[R CC synth-2D]
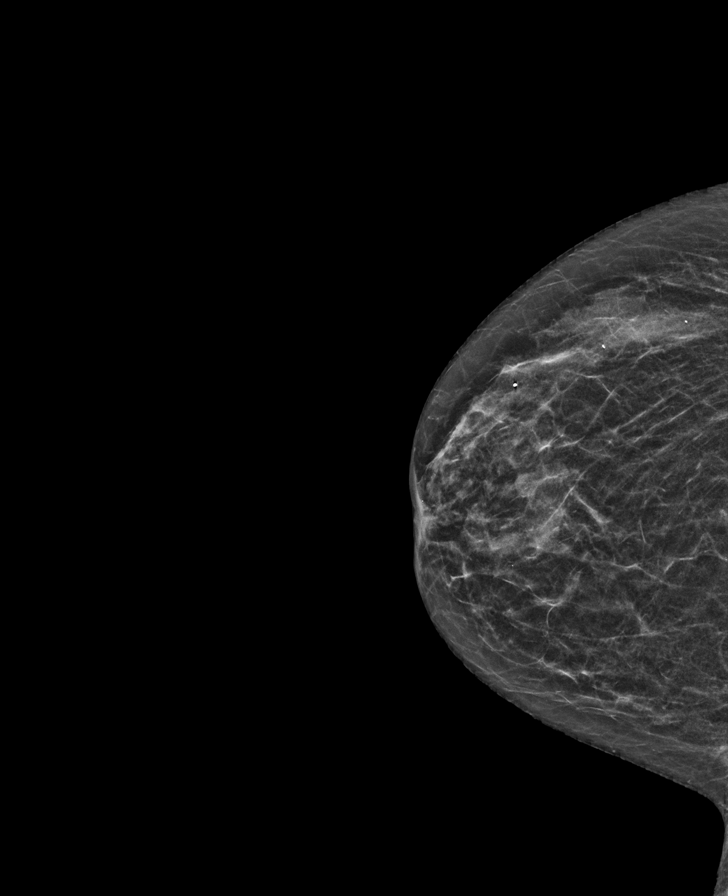

[R MLO synth-2D]
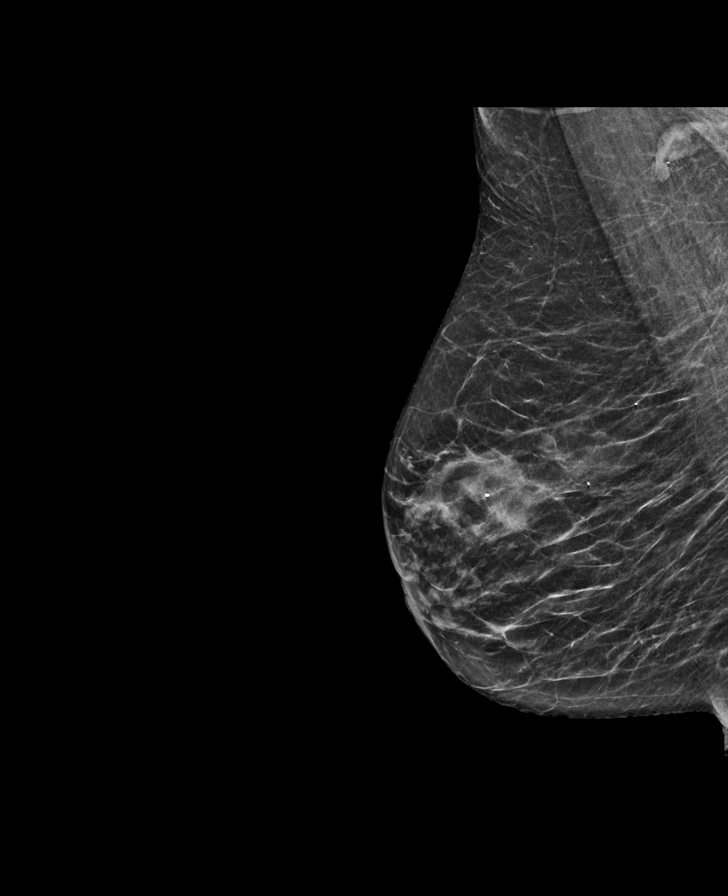

[L CC synth-2D]
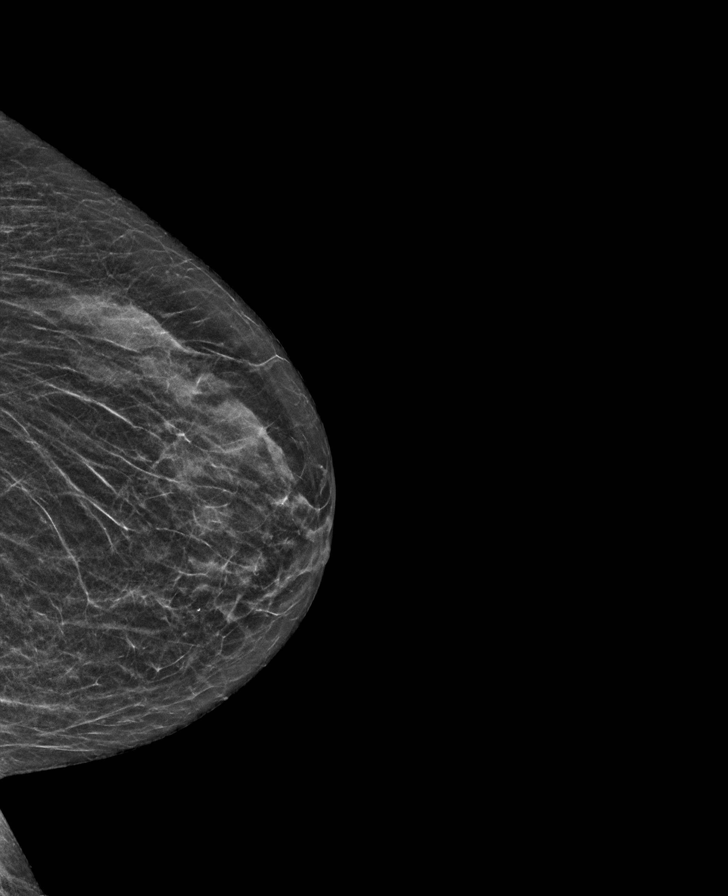

[L CC tomo · tomo slice 21/41.0]
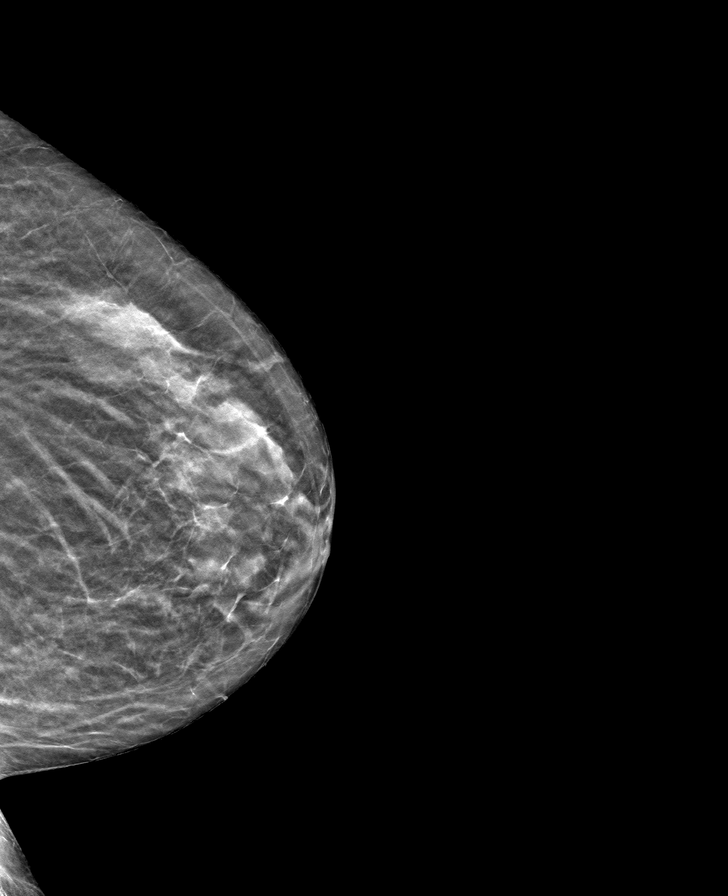

[R MLO tomo · tomo slice 25/49.0]
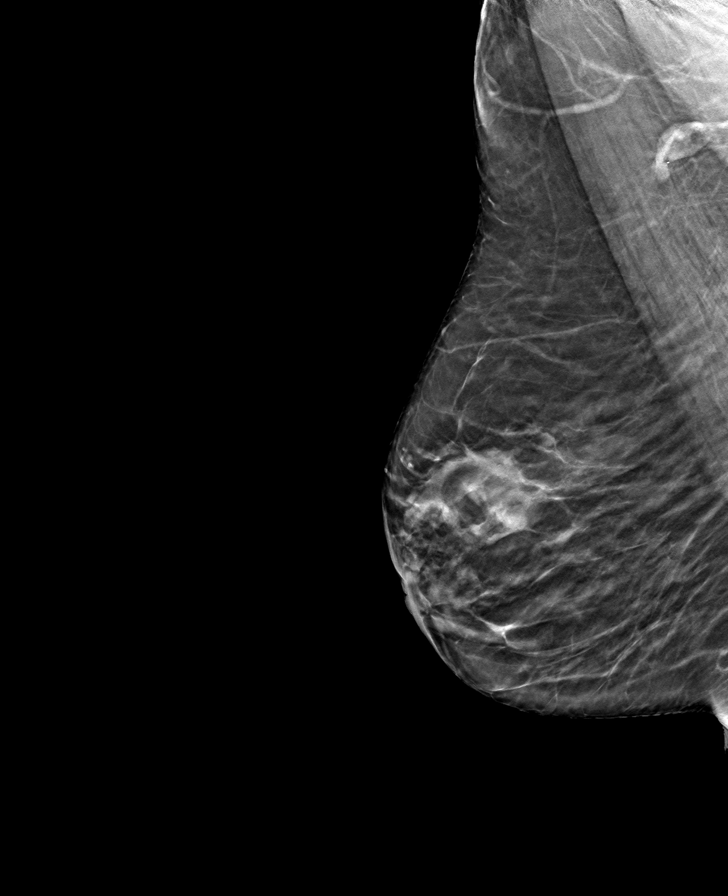

[R CC tomo · tomo slice 23/44.0]
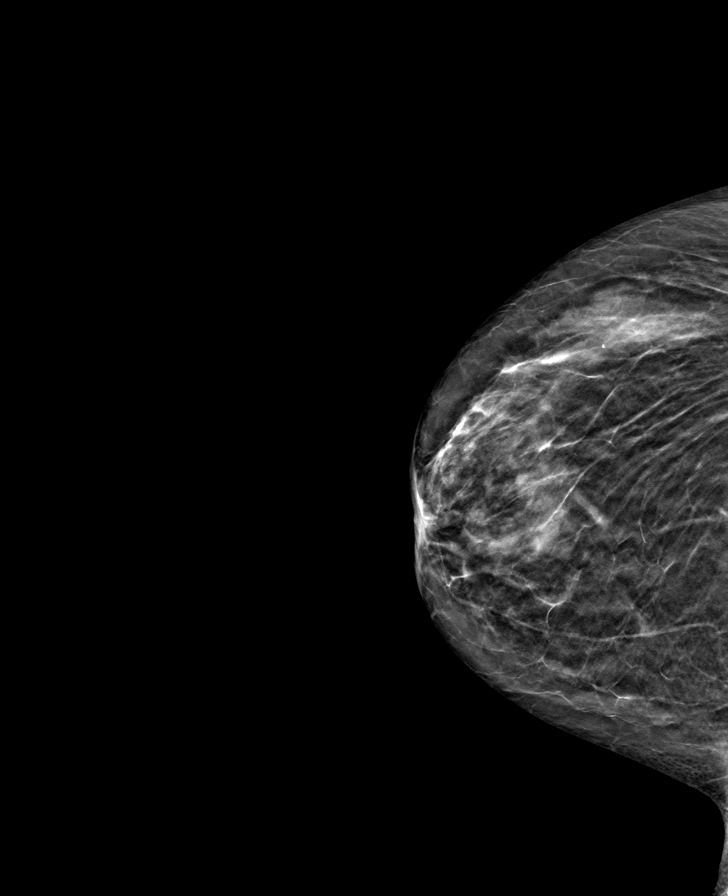

[L MLO tomo · tomo slice 23/45.0]
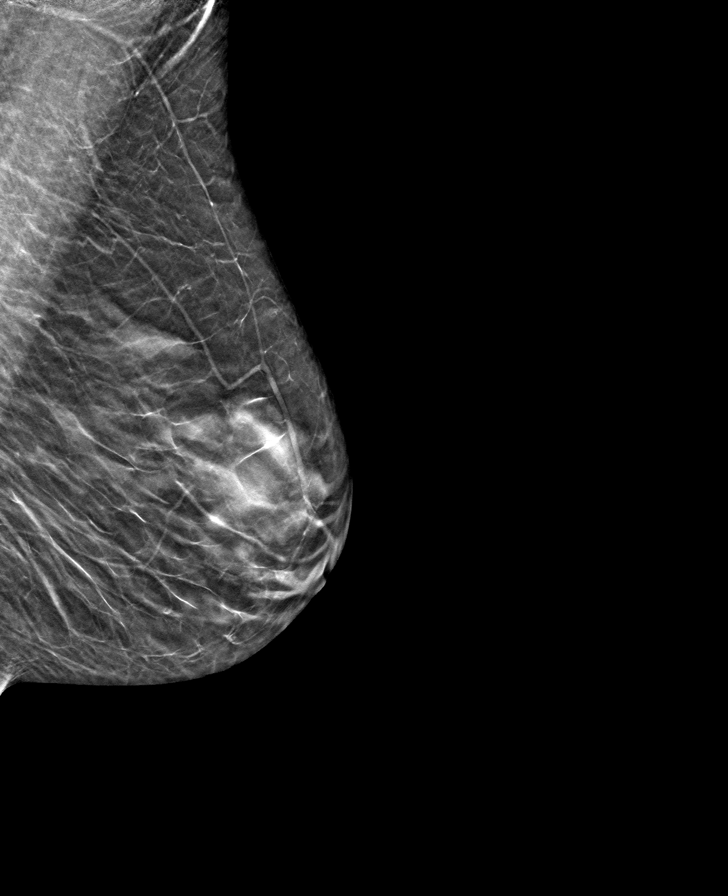

[8 of 24 positions shown; findings below may reference images not displayed]

ACR Breast Density Category b: There are scattered areas of
fibroglandular density.
FINDINGS: There are no findings suspicious for malignancy. Images were
processed with CAD.
IMPRESSION: No mammographic evidence of malignancy. A result letter of this
screening mammogram will be mailed directly to the patient.

RECOMMENDATION:
Screening mammogram in one year. (Code:[id number])

BI-RADS CATEGORY  1: Negative.

## 2021-10-27 ENCOUNTER — Other Ambulatory Visit: Payer: Self-pay

## 2021-10-27 ENCOUNTER — Encounter (HOSPITAL_BASED_OUTPATIENT_CLINIC_OR_DEPARTMENT_OTHER): Payer: Self-pay

## 2021-10-27 ENCOUNTER — Emergency Department (HOSPITAL_BASED_OUTPATIENT_CLINIC_OR_DEPARTMENT_OTHER): Payer: Worker's Compensation

## 2021-10-27 ENCOUNTER — Emergency Department (HOSPITAL_BASED_OUTPATIENT_CLINIC_OR_DEPARTMENT_OTHER): Payer: Worker's Compensation | Admitting: Radiology

## 2021-10-27 ENCOUNTER — Emergency Department (HOSPITAL_BASED_OUTPATIENT_CLINIC_OR_DEPARTMENT_OTHER)
Admission: EM | Admit: 2021-10-27 | Discharge: 2021-10-27 | Disposition: A | Discharge status: Home / Self Care | Payer: Worker's Compensation | Attending: Emergency Medicine | Admitting: Emergency Medicine

## 2021-10-27 DIAGNOSIS — M79601 Pain in right arm: Secondary | ICD-10-CM

## 2021-10-27 DIAGNOSIS — M79661 Pain in right lower leg: Secondary | ICD-10-CM | POA: Insufficient documentation

## 2021-10-27 DIAGNOSIS — W19XXXA Unspecified fall, initial encounter: Secondary | ICD-10-CM

## 2021-10-27 DIAGNOSIS — W01198A Fall on same level from slipping, tripping and stumbling with subsequent striking against other object, initial encounter: Secondary | ICD-10-CM | POA: Insufficient documentation

## 2021-10-27 DIAGNOSIS — S0093XA Contusion of unspecified part of head, initial encounter: Secondary | ICD-10-CM | POA: Diagnosis not present

## 2021-10-27 DIAGNOSIS — Y990 Civilian activity done for income or pay: Secondary | ICD-10-CM | POA: Diagnosis not present

## 2021-10-27 DIAGNOSIS — S0990XA Unspecified injury of head, initial encounter: Secondary | ICD-10-CM | POA: Diagnosis present

## 2021-10-27 DIAGNOSIS — M25511 Pain in right shoulder: Secondary | ICD-10-CM | POA: Diagnosis not present

## 2021-10-27 DIAGNOSIS — M542 Cervicalgia: Secondary | ICD-10-CM | POA: Insufficient documentation

## 2021-10-27 DIAGNOSIS — M79662 Pain in left lower leg: Secondary | ICD-10-CM | POA: Diagnosis not present

## 2021-10-27 DIAGNOSIS — R053 Chronic cough: Secondary | ICD-10-CM | POA: Diagnosis not present

## 2021-10-27 DIAGNOSIS — F17200 Nicotine dependence, unspecified, uncomplicated: Secondary | ICD-10-CM | POA: Insufficient documentation

## 2021-10-27 DIAGNOSIS — S8001XA Contusion of right knee, initial encounter: Secondary | ICD-10-CM | POA: Insufficient documentation

## 2021-10-27 DIAGNOSIS — M25521 Pain in right elbow: Secondary | ICD-10-CM | POA: Diagnosis not present

## 2021-10-27 MED ORDER — HYDROCODONE-ACETAMINOPHEN 5-325 MG PO TABS
2.0000 | ORAL_TABLET | ORAL | Status: AC
  Administered 2021-10-27: 2 via ORAL
  Filled 2021-10-27: qty 2

## 2021-10-27 NOTE — ED Provider Notes (Signed)
[name] Provider Note   CSN: [id number] Arrival date & time: 10/27/21  [id number]     History  Chief Complaint  Patient presents with   Lytle Michaels    Meagan Lambert is a 71 y.o. female.  HPI 71 yo female with mechanical fall today at work.  She tripped on a flower pot and fell forward and fell on both hands.  Pain right arm, radiates to neck, patient able to get up and walk.  She denies blood thinners. Patient has chronic cough, is a smoker, and has inhalers. PMD Dr. Melford Aase      Home Medications Prior to Admission medications   Medication Sig Start Date End Date Taking? Authorizing Provider  acetaminophen (TYLENOL) 325 MG tablet Take 650 mg by mouth every 6 (six) hours as needed for headache.    [provider]  ALPRAZolam Duanne Moron) 0.5 MG tablet Take 0.5 mg by mouth at bedtime.  03/26/12   [provider]  calcium carbonate (TUMS - DOSED IN MG ELEMENTAL CALCIUM) 500 MG chewable tablet Chew 1 tablet by mouth daily.    [provider]  fluticasone (FLONASE) 50 MCG/ACT nasal spray Place 2 sprays into both nostrils daily for 5 days. 07/30/20 08/04/20  Gareth Morgan, MD  HYDROcodone-acetaminophen (LORTAB) 7.5-500 MG per tablet Take 1 tablet by mouth daily.  04/02/12   [provider]  mometasone-formoterol (DULERA) 200-5 MCG/ACT AERO Inhale 2 puffs into the lungs every 12 (twelve) hours. 09/04/18   Tanda Rockers, MD  PARoxetine (PAXIL) 20 MG tablet Take 20 mg by mouth daily.    [provider]  Respiratory Therapy Supplies (FLUTTER) DEVI Use as directed 09/04/18   Tanda Rockers, MD      Allergies    Sulfa antibiotics    Review of Systems   Review of Systems  All other systems reviewed and are negative.  Physical Exam Updated Vital Signs BP (!) 155/66    Pulse 60    Temp 97.8 F (36.6 C) (Oral)    Resp 16    Ht 1.549 m (5\' 1" )    Wt 43.2 kg    SpO2 99%    BMI 17.99 kg/m  Physical Exam Vitals and nursing note  reviewed.  Constitutional:      Appearance: Normal appearance.  HENT:     Head: Normocephalic.     Right Ear: External ear normal.     Left Ear: External ear normal.     Nose: Nose normal.     Mouth/Throat:     Pharynx: Oropharynx is clear.  Eyes:     Conjunctiva/sclera: Conjunctivae normal.  Neck:     Comments: No posterior point ttp, ttp right lateral neck Cardiovascular:     Rate and Rhythm: Normal rate and regular rhythm.     Pulses: Normal pulses.  Pulmonary:     Effort: Pulmonary effort is normal.     Breath sounds: Normal breath sounds.  Abdominal:     General: Abdomen is flat.     Palpations: Abdomen is soft.  Skin:    General: Skin is warm and dry.     Capillary Refill: Capillary refill takes less than 2 seconds.     Comments: Right shoulder ttp Right arm ttp Right elbow mildly ttp Right knee contusion over patella NO right hip ttp Full arom bilateral lower extremity pain  Neurological:     General: No focal deficit present.     Mental Status: She is alert.  Psychiatric:  Mood and Affect: Mood normal.    ED Results / Procedures / Treatments   Labs (all labs ordered are listed, but only abnormal results are displayed) Labs Reviewed - No data to display  EKG None  Radiology DG Shoulder Right  Result Date: 10/27/2021 CLINICAL DATA:  Fall today EXAM: RIGHT SHOULDER - 2+ VIEW COMPARISON:  None. FINDINGS: There is no evidence of fracture or dislocation. There is no evidence of arthropathy or other focal bone abnormality. Soft tissues are unremarkable. IMPRESSION: Negative. Electronically Signed   By: Marlan Palau M.D.   On: 10/27/2021 09:56   DG Elbow Complete Right  Result Date: 10/27/2021 CLINICAL DATA:  Fall today EXAM: RIGHT ELBOW - COMPLETE 3+ VIEW COMPARISON:  None. FINDINGS: Normal alignment no fracture. Degenerative change in the elbow. Joint space narrowing between the capitellum and radial head. Mild soft tissue calcification lateral to the  lateral epicondyle. No joint effusion. IMPRESSION: Negative for fracture Electronically Signed   By: Marlan Palau M.D.   On: 10/27/2021 09:57   DG Wrist Complete Left  Result Date: 10/27/2021 CLINICAL DATA:  Fall today. EXAM: LEFT WRIST - COMPLETE 3+ VIEW COMPARISON:  None. FINDINGS: Normal alignment no fracture. Moderate degenerative changes base of thumb with joint space narrowing and spurring. Mild subluxation. IMPRESSION: Negative for fracture. Electronically Signed   By: Marlan Palau M.D.   On: 10/27/2021 10:00   DG Wrist Complete Right  Result Date: 10/27/2021 CLINICAL DATA:  Fall today EXAM: RIGHT WRIST - COMPLETE 3+ VIEW COMPARISON:  None. FINDINGS: Normal alignment. No fracture. Mild degenerative change at the radioulnar joint. IMPRESSION: Negative for fracture. Electronically Signed   By: Marlan Palau M.D.   On: 10/27/2021 09:59   CT Head Wo Contrast  Result Date: 10/27/2021 CLINICAL DATA:  Status post fall, no loss of consciousness. EXAM: CT HEAD WITHOUT CONTRAST CT MAXILLOFACIAL WITHOUT CONTRAST CT CERVICAL SPINE WITHOUT CONTRAST TECHNIQUE: Multidetector CT imaging of the head, cervical spine, and maxillofacial structures were performed using the standard protocol without intravenous contrast. Multiplanar CT image reconstructions of the cervical spine and maxillofacial structures were also generated. RADIATION DOSE REDUCTION: This exam was performed according to the departmental dose-optimization program which includes automated exposure control, adjustment of the mA and/or kV according to patient size and/or use of iterative reconstruction technique. COMPARISON:  None. FINDINGS: CT HEAD FINDINGS Brain: No evidence of acute infarction, hemorrhage, extra-axial collection, ventriculomegaly, or mass effect. Generalized cerebral atrophy. Periventricular white matter low attenuation likely secondary to microangiopathy. Vascular: Cerebrovascular atherosclerotic calcifications are noted. Skull:  Negative for fracture or focal lesion. Sinuses/Orbits: Visualized portions of the orbits are unremarkable. Visualized portions of the paranasal sinuses are unremarkable. Visualized portions of the mastoid air cells are unremarkable. Other: None. CT MAXILLOFACIAL FINDINGS Osseous: No fracture or mandibular dislocation. No destructive process. Osteoarthritis of bilateral temporomandibular joints. Orbits: Negative. No traumatic or inflammatory finding. Sinuses: Paranasal sinuses are clear.  Mastoid sinuses are clear. Soft tissues: Negative. CT CERVICAL SPINE FINDINGS Alignment: 2 mm anterolisthesis of C7 on T1. Skull base and vertebrae: No acute fracture. No aggressive lytic or sclerotic osseous lesion. Soft tissues and spinal canal: Intraspinal soft tissues are not fully imaged on this examination due to poor soft tissue contrast, but there is no gross soft tissue abnormality. No prevertebral fluid or swelling. No visible canal hematoma. Disc levels: Severe degenerative disease with disc height loss at C5-6 and to lesser extent C4-5 with mild bilateral facet arthropathy. Bilateral uncovertebral degenerative changes at C4-5 and C5-6 with  foraminal encroachment. Osseous fusion across the C6-7 disc space and right posterior elements. Osseous fusion of the left posterior elements of C3-4. Severe bilateral facet arthropathy at C2-3. Upper chest: Lung apices are clear.  Biapical emphysematous changes. Other: No fluid collection or hematoma. Bilateral carotid artery atherosclerosis. IMPRESSION: 1. No acute intracranial pathology. 2.  No acute osseous injury of the maxillofacial bones. 3.  No acute osseous injury of the cervical spine. 4. Cervical spine spondylosis as described above. Electronically Signed   By: Kathreen Devoid M.D.   On: 10/27/2021 09:50   CT Cervical Spine Wo Contrast  Result Date: 10/27/2021 CLINICAL DATA:  Status post fall, no loss of consciousness. EXAM: CT HEAD WITHOUT CONTRAST CT MAXILLOFACIAL WITHOUT  CONTRAST CT CERVICAL SPINE WITHOUT CONTRAST TECHNIQUE: Multidetector CT imaging of the head, cervical spine, and maxillofacial structures were performed using the standard protocol without intravenous contrast. Multiplanar CT image reconstructions of the cervical spine and maxillofacial structures were also generated. RADIATION DOSE REDUCTION: This exam was performed according to the departmental dose-optimization program which includes automated exposure control, adjustment of the mA and/or kV according to patient size and/or use of iterative reconstruction technique. COMPARISON:  None. FINDINGS: CT HEAD FINDINGS Brain: No evidence of acute infarction, hemorrhage, extra-axial collection, ventriculomegaly, or mass effect. Generalized cerebral atrophy. Periventricular white matter low attenuation likely secondary to microangiopathy. Vascular: Cerebrovascular atherosclerotic calcifications are noted. Skull: Negative for fracture or focal lesion. Sinuses/Orbits: Visualized portions of the orbits are unremarkable. Visualized portions of the paranasal sinuses are unremarkable. Visualized portions of the mastoid air cells are unremarkable. Other: None. CT MAXILLOFACIAL FINDINGS Osseous: No fracture or mandibular dislocation. No destructive process. Osteoarthritis of bilateral temporomandibular joints. Orbits: Negative. No traumatic or inflammatory finding. Sinuses: Paranasal sinuses are clear.  Mastoid sinuses are clear. Soft tissues: Negative. CT CERVICAL SPINE FINDINGS Alignment: 2 mm anterolisthesis of C7 on T1. Skull base and vertebrae: No acute fracture. No aggressive lytic or sclerotic osseous lesion. Soft tissues and spinal canal: Intraspinal soft tissues are not fully imaged on this examination due to poor soft tissue contrast, but there is no gross soft tissue abnormality. No prevertebral fluid or swelling. No visible canal hematoma. Disc levels: Severe degenerative disease with disc height loss at C5-6 and to  lesser extent C4-5 with mild bilateral facet arthropathy. Bilateral uncovertebral degenerative changes at C4-5 and C5-6 with foraminal encroachment. Osseous fusion across the C6-7 disc space and right posterior elements. Osseous fusion of the left posterior elements of C3-4. Severe bilateral facet arthropathy at C2-3. Upper chest: Lung apices are clear.  Biapical emphysematous changes. Other: No fluid collection or hematoma. Bilateral carotid artery atherosclerosis. IMPRESSION: 1. No acute intracranial pathology. 2.  No acute osseous injury of the maxillofacial bones. 3.  No acute osseous injury of the cervical spine. 4. Cervical spine spondylosis as described above. Electronically Signed   By: Kathreen Devoid M.D.   On: 10/27/2021 09:50   DG Knee Complete 4 Views Right  Result Date: 10/27/2021 CLINICAL DATA:  Fall today EXAM: RIGHT KNEE - COMPLETE 4+ VIEW COMPARISON:  None. FINDINGS: Negative for fracture or joint effusion. Mild degenerative change in the knee with mild joint space narrowing and spurring medially and laterally. IMPRESSION: Negative for fracture Electronically Signed   By: Franchot Gallo M.D.   On: 10/27/2021 10:00   CT Maxillofacial Wo Contrast  Result Date: 10/27/2021 CLINICAL DATA:  Status post fall, no loss of consciousness. EXAM: CT HEAD WITHOUT CONTRAST CT MAXILLOFACIAL WITHOUT CONTRAST CT CERVICAL SPINE WITHOUT  CONTRAST TECHNIQUE: Multidetector CT imaging of the head, cervical spine, and maxillofacial structures were performed using the standard protocol without intravenous contrast. Multiplanar CT image reconstructions of the cervical spine and maxillofacial structures were also generated. RADIATION DOSE REDUCTION: This exam was performed according to the departmental dose-optimization program which includes automated exposure control, adjustment of the mA and/or kV according to patient size and/or use of iterative reconstruction technique. COMPARISON:  None. FINDINGS: CT HEAD FINDINGS  Brain: No evidence of acute infarction, hemorrhage, extra-axial collection, ventriculomegaly, or mass effect. Generalized cerebral atrophy. Periventricular white matter low attenuation likely secondary to microangiopathy. Vascular: Cerebrovascular atherosclerotic calcifications are noted. Skull: Negative for fracture or focal lesion. Sinuses/Orbits: Visualized portions of the orbits are unremarkable. Visualized portions of the paranasal sinuses are unremarkable. Visualized portions of the mastoid air cells are unremarkable. Other: None. CT MAXILLOFACIAL FINDINGS Osseous: No fracture or mandibular dislocation. No destructive process. Osteoarthritis of bilateral temporomandibular joints. Orbits: Negative. No traumatic or inflammatory finding. Sinuses: Paranasal sinuses are clear.  Mastoid sinuses are clear. Soft tissues: Negative. CT CERVICAL SPINE FINDINGS Alignment: 2 mm anterolisthesis of C7 on T1. Skull base and vertebrae: No acute fracture. No aggressive lytic or sclerotic osseous lesion. Soft tissues and spinal canal: Intraspinal soft tissues are not fully imaged on this examination due to poor soft tissue contrast, but there is no gross soft tissue abnormality. No prevertebral fluid or swelling. No visible canal hematoma. Disc levels: Severe degenerative disease with disc height loss at C5-6 and to lesser extent C4-5 with mild bilateral facet arthropathy. Bilateral uncovertebral degenerative changes at C4-5 and C5-6 with foraminal encroachment. Osseous fusion across the C6-7 disc space and right posterior elements. Osseous fusion of the left posterior elements of C3-4. Severe bilateral facet arthropathy at C2-3. Upper chest: Lung apices are clear.  Biapical emphysematous changes. Other: No fluid collection or hematoma. Bilateral carotid artery atherosclerosis. IMPRESSION: 1. No acute intracranial pathology. 2.  No acute osseous injury of the maxillofacial bones. 3.  No acute osseous injury of the cervical  spine. 4. Cervical spine spondylosis as described above. Electronically Signed   By: Kathreen Devoid M.D.   On: 10/27/2021 09:50    Procedures Procedures    Medications Ordered in ED Medications  HYDROcodone-acetaminophen (NORCO/VICODIN) 5-325 MG per tablet 2 tablet (2 tablets Oral Given 10/27/21 0916)    ED Course/ Medical Decision Making/ A&P Clinical Course as of 10/27/21 1017  Wed Oct 27, 2021  1010 Reviewed imaging of the left wrist, right knee, right wrist, right elbow, right shoulder, CT head, cervical spine, and maxillofacial.  No evidence of acute abnormalities were noted on my review of the studies and radiologist interpretation reviewed and no evidence of acute abnormalities noted [DR]    Clinical Course User Index [DR] Pattricia Boss, MD                           Medical Decision Making 71 year old female presents today with fall and right upper extremity pain.  On physical exam she has multiple areas of tenderness with contusion to right knee.  She has tender area of her mandible.  There is a an injury to the face and head when she fell without any evidence externally of trauma.  Imaging was obtained as noted above.  There is no evidence of fracture or acute brain injury.  Patient was given dose of hydrocodone which she normally takes at home here in ED. Plan shoulder immobilizer Discussed need for  follow-up, return precautions, and conservative therapy with patient  Amount and/or Complexity of Data Reviewed Radiology: ordered.  Risk Prescription drug management.           Final Clinical Impression(s) / ED Diagnoses Final diagnoses:  Fall, initial encounter  Right arm pain  Contusion of head, unspecified part of head, initial encounter    Rx / DC Orders ED Discharge Orders     None         Pattricia Boss, MD 10/27/21 1017

## 2021-10-27 NOTE — ED Notes (Signed)
Patient transported to CT 

## 2021-10-27 NOTE — Discharge Instructions (Signed)
No evidence of injury to bone or brain were noted on your x-rays or CAT scans today ?Please use cold therapy, shoulder immobilization, and your ongoing pain medicine for management of your shoulder pain ?Return immediately if you are having worsening symptoms such as worsening headache, weakness, pain, or shortness of breath. ?Recheck with your primary care physician in the next week if symptoms have not resolved. ?

## 2021-10-27 NOTE — ED Notes (Signed)
ED Provider at bedside. 

## 2021-10-27 NOTE — ED Triage Notes (Signed)
Onset today at 0630 tripped and fell onto right side.  Hit head but denies LOC.  Pain and limited ROM to right shoulder.  Pain to right knee and left wrist.  Pain to right side head.  Right side neck pain ?

## 2023-06-27 ENCOUNTER — Encounter: Payer: Self-pay | Admitting: Internal Medicine

## 2023-06-27 ENCOUNTER — Ambulatory Visit: Payer: Medicare Other | Admitting: Internal Medicine

## 2023-06-27 VITALS — BP 112/68 | HR 69 | Temp 97.9°F | Ht 62.0 in | Wt 117.4 lb

## 2023-06-27 DIAGNOSIS — J449 Chronic obstructive pulmonary disease, unspecified: Secondary | ICD-10-CM | POA: Diagnosis not present

## 2023-06-27 MED ORDER — BREZTRI AEROSPHERE 160-9-4.8 MCG/ACT IN AERO: 2.0000 | INHALATION_SPRAY | Freq: Two times a day (BID) | RESPIRATORY_TRACT | Status: DC

## 2023-06-27 MED ORDER — BREZTRI AEROSPHERE 160-9-4.8 MCG/ACT IN AERO: 2.0000 | INHALATION_SPRAY | Freq: Two times a day (BID) | RESPIRATORY_TRACT | 11 refills | Status: DC

## 2023-06-27 NOTE — Progress Notes (Signed)
Meagan Lambert, female    DOB: March 07, 1951,    MRN: [id number]    Brief patient profile:  4 yowf  LPN  quit smoking 03/2022/MM   previously very active including some jogging as adult but around 2018 noted decreased ex tol and stopped joggying and gradually worse to point where doe to driveway  Maybe 150 ft flat so referred to pulmonary clinic 09/04/2018 by Stephens Shire with GOLD II criteria on initial spirometry 09/04/2018     History of Present Illness  09/04/2018  Pulmonary/ 1st office eval/Maalik Pinn  Chief Complaint  Patient presents with   Pulmonary Consult    Referred by Stephens Shire PA. Pt c/o cough and SOB x 6 months. She is coughing up clear sputum. She gets SOB walking to the end of her driveway. She has an atrovent inhaler she uses approx 2 x per wk.   Dyspnea:  150 ft and stops Cough: clear mucus esp in am's and hs but hard time coughing it up  Sleep:  Takes xanax 2 pillows/bed flat SABA use: atovent hfa / doesn't really help Rec Dulera 200 Take 2 puffs first thing in am and then another 2 puffs about 12 hours later.  Work on inhaler technique:    The key is to stop smoking completely before smoking completely stops you! Please schedule a follow up office visit in 6 weeks, call sooner if needed with full pfts > not done     06/27/2023  f/u ov/Natoria Archibald re: GOLD 2 copd  maint on no treatment   Chief Complaint  Patient presents with   Consult    Review CT from [facility] 03/28/2023.  Established patient Dr. Sherene Sires from 2020.  Dyspnea:  mb and back is about 100 ft and back to house doe  Cough: minimal rattle  Sleeping: bed is flat / on side and 2 pillows s resp cc  SABA use: none  02: none Lung cancer screening :  LDSCT  03/28/23  RADS 2     No obvious day to day or daytime variability or assoc excess/ purulent sputum or mucus plugs or hemoptysis or cp or chest tightness, subjective wheeze or overt sinus or hb symptoms.    Also denies any obvious fluctuation of symptoms with weather or  environmental changes or other aggravating or alleviating factors except as outlined above   No unusual exposure hx or h/o childhood pna/ asthma or knowledge of premature birth.  Current Allergies, Complete Past Medical History, Past Surgical History, Family History, and Social History were reviewed in [employer] record.  ROS  The following are not active complaints unless bolded Hoarseness, sore throat, dysphagia, dental problems, itching, sneezing,  nasal congestion or discharge of excess mucus or purulent secretions, ear ache,   fever, chills, sweats, unintended wt loss or wt gain attibuted to stop smoking, classically pleuritic or exertional cp,  orthopnea pnd or arm/hand swelling  or leg swelling, presyncope, palpitations, abdominal pain, anorexia, nausea, vomiting, diarrhea  or change in bowel habits or change in bladder habits, change in stools or change in urine, dysuria, hematuria,  rash, arthralgias, visual complaints, headache, numbness, weakness or ataxia or problems with walking or coordination,  change in mood or  memory.        Current Meds  Medication Sig   acetaminophen (TYLENOL) 325 MG tablet Take 650 mg by mouth every 6 (six) hours as needed for headache.   ALPRAZolam (XANAX) 0.5 MG tablet Take 0.5 mg by mouth  at bedtime. Take 1-1 1/2 tablets at bedtime as needed.   calcium carbonate (TUMS - DOSED IN MG ELEMENTAL CALCIUM) 500 MG chewable tablet Chew 1 tablet by mouth daily.   Cyanocobalamin (B-12 PO) Take by mouth.   HYDROcodone-acetaminophen (LORTAB) 7.5-500 MG per tablet Take 2 tablets by mouth daily as needed for pain.   loratadine (CLARITIN) 10 MG tablet Take 10 mg by mouth daily as needed for allergies.   Multiple Vitamins-Minerals (ZINC PO) Take by mouth.   PARoxetine (PAXIL) 30 MG tablet Take 30 mg by mouth daily.   VITAMIN D PO Take by mouth.         Past Medical History:  Diagnosis Date   Anxiety    Depression    Dizziness    DJD  (degenerative joint disease)    DOE (dyspnea on exertion)    Exertional chest pain    H/O heartburn    Infections of kidney    h/o   Knee pain, left    Ringing in ears    Unstable angina (HCC)    Weakness generalized          Objective:    Wts   06/27/2023     117    10/27/21 95 lb 3.2 oz (43.2 kg)  09/04/18 115 lb 9.6 oz (52.4 kg)  04/24/12 142 lb (64.4 kg)      Vital signs reviewed  06/27/2023  - Note at rest 02 sats  98% on RA   General appearance:    amb pleasant wf nad     HEENT : Oropharynx  clear   Nasal turbinates nl    NECK :  without  apparent JVD/ palpable Nodes/TM    LUNGS: no acc muscle use,  Mild barrel  contour chest wall with bilateral  Distant bs s audible wheeze and  without cough on insp or exp maneuvers  and mild  Hyperresonant  to  percussion bilaterally     CV:  RRR  no s3 or murmur or increase in P2, and no edema   ABD:  soft and nontender   MS:  Nl gait/ ext warm without deformities Or obvious joint restrictions  calf tenderness, cyanosis or clubbing     SKIN: warm and dry without lesions    NEURO:  alert, approp, nl sensorium with  no motor or cerebellar deficits apparent.      I personally reviewed images and agree with radiology impression as follows:   Chest CT LDSCT  04/26/18  Moderate centrilobular emphysema  LDSCT  03/28/23  RADS 2       Assessment

## 2023-06-27 NOTE — Assessment & Plan Note (Addendum)
Quit smoking 03/2022  with prominent CB features   LDSCT  04/26/18  Moderate centrilobular emphysema  - Spirometry 09/04/2018  FEV1 1.2 (57%)  Ratio 52 with classic curvature  - 09/04/2018   try dulera 200 2bid  - flutter valve training  09/04/2018   - [employer] AT screen 09/04/18  MM  Level 129  - Quit smoking 03/2022  - 06/27/2023  After extensive coaching inhaler device,  effectiveness =    75% (short Ti) hfa > try breztri samples    Group D (now reclassified as E) in terms of symptom/risk and laba/lama/ICS  therefore appropriate rx at this point >>>  breztri and approp saba prior to returning here for pfts - if stays exac free off cigs then next step is to step down to LAMA/LABA ie either stiolto or bevespi would do.     Discussed in detail all the  indications, usual  risks and alternatives  relative to the benefits with patient who agrees to proceed with w/u and rx  as outlined.            Each maintenance medication was reviewed in detail including emphasizing most importantly the difference between maintenance and prns and under what circumstances the prns are to be triggered using an action plan format where appropriate.  Total time for H and P, chart review, counseling, reviewing hfa device(s) and generating customized AVS unique to this office visit / same day charting  > 30 min ov with pt not seen in > 3y

## 2023-06-27 NOTE — Patient Instructions (Signed)
Plan A = Automatic = Always=    Breztri Take 2 puffs first thing in am and then another 2 puffs about 12 hours later.    Work on inhaler technique:  relax and gently blow all the way out then take a nice smooth full deep breath back in, triggering the inhaler at same time you start breathing in.  Hold breath in for at least  5 seconds if you can. Blow out [employer]  thru nose. Rinse and gargle with water when done.  If mouth or throat bother you at all,  try brushing teeth/gums/tongue with arm and hammer toothpaste/ make a slurry and gargle and spit out.   >>>  Remember how golfers warm up by taking practice swings - do this with an empty inhaler   Plan B = Backup (to supplement plan A, not to replace it) Only use your albuterol inhaler as a rescue medication to be used if you can't catch your breath by resting or doing a relaxed purse lip breathing pattern.  - The less you use it, the better it will work when you need it. - Ok to use the inhaler up to 2 puffs  every 4 hours if you must but call for appointment if use goes up over your usual need - Don't leave home without it !!  (think of it like the spare tire for your car)    Please schedule a follow up visit in 3 months but call sooner if needed  with all respiratory medications /inhalers/ solutions in hand so we can verify exactly what you are taking. This includes all medications from all doctors and over the counters   PFTs on return

## 2023-09-05 ENCOUNTER — Telehealth: Payer: Self-pay | Admitting: Internal Medicine

## 2023-09-05 NOTE — Telephone Encounter (Signed)
 Judeth Cornfield states patient currently taking Breztri and having symptom of shortness of breath. Also would like to know if patient could do pulmonary rehab. Judeth Cornfield phone number is [phone].

## 2023-09-06 NOTE — Telephone Encounter (Signed)
 Spoke with Corean at [facility]  Pt was seen there yesterday  She states that the pt had reported SOB and chest tightness despite use of Breztri  inhaler  She also was asking about pulmonary rehab   I called the pt to discuss her symptoms  She did not answer my call-there was no option to leave msg-line rang and then went to fast busy tone  Will call back

## 2023-09-06 NOTE — Telephone Encounter (Signed)
 Meagan Lambert is returning phone call. Meagan Lambert phone number is [phone].

## 2023-09-06 NOTE — Telephone Encounter (Signed)
 I called [facility] Judeth Cornfield)- she was unavailable- LMTCB.

## 2023-09-07 NOTE — Telephone Encounter (Signed)
 I called and spoke with the pt  She states that she is doing well with her breathing and that there is nothing needed  She said that the Breztri  has really helped her breathing  She has appt with Dr Darlean March 2025 and states will call sooner if anything comes up and she has changes in her breathing  Nothing further needed

## 2023-09-25 ENCOUNTER — Encounter (HOSPITAL_BASED_OUTPATIENT_CLINIC_OR_DEPARTMENT_OTHER): Payer: Medicare Other

## 2023-10-09 ENCOUNTER — Ambulatory Visit (HOSPITAL_BASED_OUTPATIENT_CLINIC_OR_DEPARTMENT_OTHER): Payer: Medicare Other | Admitting: Internal Medicine

## 2023-10-09 DIAGNOSIS — J449 Chronic obstructive pulmonary disease, unspecified: Secondary | ICD-10-CM

## 2023-10-09 LAB — PULMONARY FUNCTION TEST
DL/VA % pred: 51 %
DL/VA: 2.16 ml/min/mmHg/L
DLCO cor % pred: 50 %
DLCO cor: 8.73 ml/min/mmHg
DLCO unc % pred: 50 %
DLCO unc: 8.73 ml/min/mmHg
FEF 25-75 Post: 0.41 L/s
FEF 25-75 Pre: 0.3 L/s
FEF2575-%Change-Post: 38 %
FEF2575-%Pred-Post: 26 %
FEF2575-%Pred-Pre: 18 %
FEV1-%Change-Post: 20 %
FEV1-%Pred-Post: 44 %
FEV1-%Pred-Pre: 37 %
FEV1-Post: 0.85 L
FEV1-Pre: 0.71 L
FEV1FVC-%Change-Post: 3 %
FEV1FVC-%Pred-Pre: 51 %
FEV6-%Change-Post: 16 %
FEV6-%Pred-Post: 82 %
FEV6-%Pred-Pre: 70 %
FEV6-Post: 1.98 L
FEV6-Pre: 1.71 L
FEV6FVC-%Change-Post: 0 %
FEV6FVC-%Pred-Post: 98 %
FEV6FVC-%Pred-Pre: 98 %
FVC-%Change-Post: 16 %
FVC-%Pred-Post: 83 %
FVC-%Pred-Pre: 71 %
FVC-Post: 2.11 L
FVC-Pre: 1.82 L
Post FEV1/FVC ratio: 40 %
Post FEV6/FVC ratio: 94 %
Pre FEV1/FVC ratio: 39 %
Pre FEV6/FVC Ratio: 94 %
RV % pred: 205 %
RV: 4.31 L
TLC % pred: 139 %
TLC: 6.42 L

## 2023-10-09 NOTE — Patient Instructions (Signed)
 Full PFT Performed Today

## 2023-10-09 NOTE — Progress Notes (Signed)
 Full PFT Performed Today

## 2023-10-24 ENCOUNTER — Other Ambulatory Visit: Payer: Self-pay | Admitting: Internal Medicine

## 2023-10-24 MED ORDER — BREZTRI AEROSPHERE 160-9-4.8 MCG/ACT IN AERO
2.0000 | INHALATION_SPRAY | Freq: Two times a day (BID) | RESPIRATORY_TRACT | 11 refills | Status: AC
Start: 2023-10-24 — End: ?

## 2023-10-24 NOTE — Progress Notes (Signed)
Spoke with pt and notified of results per Dr. Wert. Pt verbalized understanding and denied any questions. 

## 2023-11-11 NOTE — Progress Notes (Unsigned)
 Meagan Lambert, female    DOB: [birth year],    MRN: [id number]    Brief patient profile:  53 yowf  LPN  quit smoking 03/2022/MM   previously very active including some jogging as adult but around 2018 noted decreased ex tol and stopped joggying and gradually worse to point where doe to driveway  Maybe 150 ft flat so referred to pulmonary clinic 09/04/2018 by Stephens Shire with GOLD II criteria on initial spirometry 09/04/2018     History of Present Illness  09/04/2018  Pulmonary/ 1st office eval/Yuritzi Kamp  Chief Complaint  Patient presents with   Pulmonary Consult    Referred by Stephens Shire PA. Pt c/o cough and SOB x 6 months. She is coughing up clear sputum. She gets SOB walking to the end of her driveway. She has an atrovent inhaler she uses approx 2 x per wk.   Dyspnea:  150 ft and stops Cough: clear mucus esp in am's and hs but hard time coughing it up  Sleep:  Takes xanax 2 pillows/bed flat SABA use: atovent hfa / doesn't really help Rec Dulera 200 Take 2 puffs first thing in am and then another 2 puffs about 12 hours later.  Work on inhaler technique:    The key is to stop smoking completely before smoking completely stops you! Please schedule a follow up office visit in 6 weeks, call sooner if needed with full pfts > not done     06/27/2023  f/u ov/Meagan Lambert re: GOLD 2 copd  maint on no treatment   Chief Complaint  Patient presents with   Consult    Review CT from [facility] 03/28/2023.  Established patient Dr. Sherene Sires from 2020.  Dyspnea:  mb and back is about 100 ft and back to house doe  Cough: minimal rattle  Sleeping: bed is flat / on side and 2 pillows s resp cc  SABA use: none  02: none Lung cancer screening :  LDSCT  03/28/23  RADS 2   Rec    11/13/2023  f/u ov/Meagan Lambert re: ***   maint on ***  No chief complaint on file.   Dyspnea:  *** Cough: *** Sleeping: *** resp cc  SABA use: *** 02: ***  Lung cancer screening :  ***    No obvious day to day or daytime variability or  assoc excess/ purulent sputum or mucus plugs or hemoptysis or cp or chest tightness, subjective wheeze or overt sinus or hb symptoms.    Also denies any obvious fluctuation of symptoms with weather or environmental changes or other aggravating or alleviating factors except as outlined above   No unusual exposure hx or h/o childhood pna/ asthma or knowledge of premature birth.  Current Allergies, Complete Past Medical History, Past Surgical History, Family History, and Social History were reviewed in [employer] record.  ROS  The following are not active complaints unless bolded Hoarseness, sore throat, dysphagia, dental problems, itching, sneezing,  nasal congestion or discharge of excess mucus or purulent secretions, ear ache,   fever, chills, sweats, unintended wt loss or wt gain, classically pleuritic or exertional cp,  orthopnea pnd or arm/hand swelling  or leg swelling, presyncope, palpitations, abdominal pain, anorexia, nausea, vomiting, diarrhea  or change in bowel habits or change in bladder habits, change in stools or change in urine, dysuria, hematuria,  rash, arthralgias, visual complaints, headache, numbness, weakness or ataxia or problems with walking or coordination,  change in mood or  memory.  No outpatient medications have been marked as taking for the 11/13/23 encounter (Appointment) with Nyoka Cowden, MD.           Past Medical History:  Diagnosis Date   Anxiety    Depression    Dizziness    DJD (degenerative joint disease)    DOE (dyspnea on exertion)    Exertional chest pain    H/O heartburn    Infections of kidney    h/o   Knee pain, left    Ringing in ears    Unstable angina (HCC)    Weakness generalized          Objective:    Wts  11/13/2023        ***  06/27/2023     117    10/27/21 95 lb 3.2 oz (43.2 kg)  09/04/18 115 lb 9.6 oz (52.4 kg)  04/24/12 142 lb (64.4 kg)     Vital signs reviewed  11/13/2023  - Note at  rest 02 sats  ***% on ***   General appearance:    ***    Mild ba***         Assessment

## 2023-11-13 ENCOUNTER — Ambulatory Visit: Payer: Medicare Other | Admitting: Internal Medicine

## 2023-11-13 ENCOUNTER — Encounter: Payer: Self-pay | Admitting: Internal Medicine

## 2023-11-13 VITALS — BP 142/72 | HR 85 | Temp 97.8°F | Ht 61.5 in | Wt 125.0 lb

## 2023-11-13 DIAGNOSIS — Z87891 Personal history of nicotine dependence: Secondary | ICD-10-CM | POA: Diagnosis not present

## 2023-11-13 DIAGNOSIS — J449 Chronic obstructive pulmonary disease, unspecified: Secondary | ICD-10-CM | POA: Diagnosis not present

## 2023-11-13 LAB — CBC WITH DIFFERENTIAL/PLATELET
Basophils Absolute: 0 10*3/uL (ref 0.0–0.1)
Basophils Relative: 0.5 % (ref 0.0–3.0)
Eosinophils Absolute: 0.2 10*3/uL (ref 0.0–0.7)
Eosinophils Relative: 3.1 % (ref 0.0–5.0)
HCT: 37.3 % (ref 36.0–46.0)
Hemoglobin: 12.2 g/dL (ref 12.0–15.0)
Lymphocytes Relative: 20.3 % (ref 12.0–46.0)
Lymphs Abs: 1.3 10*3/uL (ref 0.7–4.0)
MCHC: 32.6 g/dL (ref 30.0–36.0)
MCV: 84.4 fl (ref 78.0–100.0)
Monocytes Absolute: 0.4 10*3/uL (ref 0.1–1.0)
Monocytes Relative: 6.8 % (ref 3.0–12.0)
Neutro Abs: 4.5 10*3/uL (ref 1.4–7.7)
Neutrophils Relative %: 69.3 % (ref 43.0–77.0)
Platelets: 285 10*3/uL (ref 150.0–400.0)
RBC: 4.42 Mil/uL (ref 3.87–5.11)
RDW: 14.1 % (ref 11.5–15.5)
WBC: 6.5 10*3/uL (ref 4.0–10.5)

## 2023-11-13 NOTE — Patient Instructions (Addendum)
 Please remember to go to the lab department  for your tests - we will call you with the results when they are available  Plan A = Automatic = Always=    Trelegy 100 one click each am  Work on inhaler technique:  relax and gently blow all the way out then take a nice smooth full deep breath back in.   Hold breath in for at least  5 seconds if you can. Blow out trelegy  thru nose. Rinse and gargle with water when done.  If mouth or throat bother you at all,  try brushing teeth/gums/tongue with arm and hammer toothpaste/ make a slurry and gargle and spit out.      Plan B = Backup (to supplement plan A, not to replace it) Only use your albuterol inhaler as a rescue medication to be used if you can't catch your breath by resting or doing a relaxed purse lip breathing pattern.  - The less you use it, the better it will work when you need it. - Ok to use the inhaler up to 2 puffs  every 4 hours if you must but call for appointment if use goes up over your usual need - Don't leave home without it !!  (think of it like the spare tire for your car)   Also  Ok to try albuterol 15 min before an activity (on alternating days)  that you know would usually make you short of breath and see if it makes any difference and if makes none then don't take albuterol after activity unless you can't catch your breath as this means it's the resting that helps, not the albuterol.      Keep up with your screening chest CT per [facility] due in  July 2025    Please schedule a follow up visit in 6 months but call sooner if needed  - [facility] office

## 2023-11-14 LAB — IGE: IgE (Immunoglobulin E), Serum: 75 kU/L (ref <=114)

## 2023-11-14 NOTE — Assessment & Plan Note (Addendum)
 Quit smoking 03/2022  with prominent CB features   LDSCT  04/26/18  Moderate centrilobular emphysema  - Spirometry 09/04/2018  FEV1 1.2 (57%)  Ratio 52 with classic curvature  - 09/04/2018   try dulera 200 2bid  - flutter valve training  09/04/2018   - [facility] AT screen 09/04/18  MM  Level 129  - Quit smoking 03/2022  - 06/27/2023  After extensive coaching inhaler device,  effectiveness =    75% (short Ti) hfa > try breztri samples  - PFT's  10/09/23  FEV1 .85 (44 % ) ratio 0.40  p 20 % improvement from saba p 0 prior to study with DLCO  8.73 (50%)   and FV curve classically concave  - 11/13/2023  After extensive coaching inhaler device,  effectiveness =    90% with DPI > rx trelegy 100 trial x 6 week samples (cost issues with breztri)   >>>  11/13/2023   Walked on RA  x 3  lap(s) =  approx 3750 ft  @ nl pace, stopped due to sob with lowest 02 sats 89%  so she does not meet criteria for amb 02 though she is close and needs to continue to monitor herself especiall once she starts back on maint rx which should improve ex tol and sats   Group D (now reclassified as E) in terms of symptom/risk and laba/lama/ICS  therefore appropriate rx at this point >>>  breztri or trelegy 100 - either way advised she'll have to spend down the deductible before her insurance covers either one to her satisfaction so she can in the meantime see which one she prefers.      Each maintenance medication was reviewed in detail including emphasizing most importantly the difference between maintenance and prns and under what circumstances the prns are to be triggered using an action plan format where appropriate.  Total time for H and P, chart review, counseling, reviewing hfa/dpi  device(s) , directly observing portions of ambulatory 02 saturation study/ and generating customized AVS unique to this office visit / same day charting = 35 min

## 2023-11-15 ENCOUNTER — Telehealth: Payer: Self-pay | Admitting: Internal Medicine

## 2023-11-15 NOTE — Telephone Encounter (Signed)
Detailed message left on identifiable machine belonging to the patient. Advised to call with any questions or concerns.

## 2023-11-15 NOTE — Progress Notes (Signed)
Called pt and there was no answer- left detailed msg on machine with results ok per DPR.

## 2023-11-15 NOTE — Telephone Encounter (Signed)
 Patient is returning a call from the clinical staff regarding lab results---[phone]

## 2024-02-21 ENCOUNTER — Encounter (HOSPITAL_BASED_OUTPATIENT_CLINIC_OR_DEPARTMENT_OTHER): Payer: Self-pay

## 2024-02-21 ENCOUNTER — Other Ambulatory Visit: Payer: Self-pay

## 2024-02-21 ENCOUNTER — Emergency Department (HOSPITAL_BASED_OUTPATIENT_CLINIC_OR_DEPARTMENT_OTHER): Admitting: Radiology

## 2024-02-21 ENCOUNTER — Emergency Department (HOSPITAL_BASED_OUTPATIENT_CLINIC_OR_DEPARTMENT_OTHER)
Admission: EM | Admit: 2024-02-21 | Discharge: 2024-02-21 | Disposition: A | Discharge status: Home / Self Care | Attending: Emergency Medicine | Admitting: Emergency Medicine

## 2024-02-21 DIAGNOSIS — W182XXA Fall in (into) shower or empty bathtub, initial encounter: Secondary | ICD-10-CM | POA: Diagnosis not present

## 2024-02-21 DIAGNOSIS — S81811A Laceration without foreign body, right lower leg, initial encounter: Secondary | ICD-10-CM | POA: Insufficient documentation

## 2024-02-21 DIAGNOSIS — Z23 Encounter for immunization: Secondary | ICD-10-CM | POA: Diagnosis not present

## 2024-02-21 DIAGNOSIS — Z79899 Other long term (current) drug therapy: Secondary | ICD-10-CM | POA: Diagnosis not present

## 2024-02-21 DIAGNOSIS — S8991XA Unspecified injury of right lower leg, initial encounter: Secondary | ICD-10-CM | POA: Diagnosis present

## 2024-02-21 DIAGNOSIS — Y92002 Bathroom of unspecified non-institutional (private) residence single-family (private) house as the place of occurrence of the external cause: Secondary | ICD-10-CM | POA: Diagnosis not present

## 2024-02-21 MED ORDER — TETANUS-DIPHTH-ACELL PERTUSSIS 5-2.5-18.5 LF-MCG/0.5 IM SUSY
0.5000 mL | PREFILLED_SYRINGE | Freq: Once | INTRAMUSCULAR | Status: AC
  Administered 2024-02-21: 0.5 mL via INTRAMUSCULAR
  Filled 2024-02-21: qty 0.5

## 2024-02-21 MED ORDER — CEPHALEXIN 500 MG PO CAPS: 500.0000 mg | ORAL_CAPSULE | Freq: Three times a day (TID) | ORAL | 0 refills | Status: AC

## 2024-02-21 MED ORDER — BACITRACIN ZINC 500 UNIT/GM EX OINT: TOPICAL_OINTMENT | Freq: Two times a day (BID) | CUTANEOUS | Status: DC

## 2024-02-21 MED ORDER — LIDOCAINE-EPINEPHRINE (PF) 2 %-1:200000 IJ SOLN
10.0000 mL | Freq: Once | INTRAMUSCULAR | Status: AC
  Administered 2024-02-21: 10 mL
  Filled 2024-02-21: qty 20

## 2024-02-21 NOTE — ED Triage Notes (Signed)
 Patient states bird bath fell on her right lower leg. Open wound to same

## 2024-02-21 NOTE — ED Provider Notes (Signed)
 [place] EMERGENCY DEPARTMENT AT [facility] Provider Note   CSN: [id number] Arrival date & time: 02/21/24  1543     Patient presents with: Wound Check   Meagan Lambert is a 73 y.o. female.   73 year old female presents today for concern of wound to the right shin that occurred earlier today.  She states she was mowing her lawn and attempted to move the birdbath out of the way.  She states she was disconnecting the top portion with a slipped out of her hand and fell striking her leg.  Unknown last tetanus shot.  No other injury.  The history is provided by the patient. No language interpreter was used.       Prior to Admission medications   Medication Sig Start Date End Date Taking? Authorizing Provider  HYDROcodone -acetaminophen  (NORCO) 7.5-325 MG tablet Take 1 tablet by mouth 2 (two) times daily. 02/19/24 03/21/24 Yes [provider]  pantoprazole (PROTONIX) 40 MG tablet Take 40 mg by mouth daily. 02/06/24  Yes [provider]  acetaminophen  (TYLENOL ) 325 MG tablet Take 650 mg by mouth every 6 (six) hours as needed for headache.    [provider]  ALPRAZolam (XANAX) 0.5 MG tablet Take 0.5 mg by mouth at bedtime. Take 1-1 1/2 tablets at bedtime as needed. 03/26/12   [provider]  Budeson-Glycopyrrol-Formoterol  (BREZTRI  AEROSPHERE) 160-9-4.8 MCG/ACT AERO Inhale 2 puffs into the lungs 2 (two) times daily. Patient not taking: Reported on 11/13/2023 10/24/23   Wert, Michael B, MD  calcium carbonate (TUMS - DOSED IN MG ELEMENTAL CALCIUM) 500 MG chewable tablet Chew 1 tablet by mouth daily.    [provider]  Cyanocobalamin (B-12 PO) Take by mouth.    [provider]  HYDROcodone -acetaminophen  (LORTAB) 7.5-500 MG per tablet Take 2 tablets by mouth daily as needed for pain. 04/02/12   [provider]  levothyroxine (SYNTHROID) 88 MCG tablet Take 88 mcg by mouth daily.    [provider]  loratadine (CLARITIN) 10  MG tablet Take 10 mg by mouth daily as needed for allergies.    [provider]  Multiple Vitamins-Minerals (ZINC PO) Take by mouth.    [provider]  PARoxetine (PAXIL) 20 MG tablet Take by mouth.    [provider]  PARoxetine (PAXIL) 30 MG tablet Take 30 mg by mouth daily.    [provider]  Respiratory Therapy Supplies (FLUTTER) DEVI Use as directed 09/04/18   Wert, Michael B, MD  VITAMIN D PO Take by mouth.    [provider]    Allergies: Sulfa antibiotics    Review of Systems  Constitutional:  Negative for chills and fever.  Skin:  Positive for wound.  All other systems reviewed and are negative.   Updated Vital Signs BP (!) 122/108 (BP Location: Left Arm)   Pulse 80   Temp 98.6 F (37 C)   Resp 17   SpO2 99%   Physical Exam Vitals and nursing note reviewed.  Constitutional:      General: She is not in acute distress.    Appearance: Normal appearance. She is not ill-appearing.  HENT:     Head: Normocephalic and atraumatic.     Nose: Nose normal.   Eyes:     Conjunctiva/sclera: Conjunctivae normal.   Pulmonary:     Effort: Pulmonary effort is normal. No respiratory distress.   Musculoskeletal:        General: No deformity.   Skin:    Findings: No  rash.     Comments: Laceration noted to right shin.  This is a L-shaped laceration measuring a total of 6 cm.  No active bleeding.  Neurovascularly intact distally.   Neurological:     Mental Status: She is alert.     (all labs ordered are listed, but only abnormal results are displayed) Labs Reviewed - No data to display  EKG: None  Radiology: DG Tibia/Fibula Right Result Date: 02/21/2024 CLINICAL DATA:  Right lower leg wound after injury. EXAM: RIGHT TIBIA AND FIBULA - 2 VIEW COMPARISON:  October 27, 2021. FINDINGS: There is no evidence of fracture or other focal bone lesions. Probable laceration seen in soft tissues medially. IMPRESSION: No fracture or dislocation.  Electronically Signed   By: Lynwood Landy Raddle M.D.   On: 02/21/2024 17:34     .SABRALac repair Ahlana Slaydon  Date/Time: 02/21/2024 7:53 PM  Performed by: Hildegard Loge, PA-C Authorized by: Hildegard Loge, PA-C   Consent:    Consent obtained:  Verbal   Consent given by:  Patient   Risks discussed:  Need for additional repair, infection, retained foreign body, poor cosmetic result and poor wound healing   Alternatives discussed:  No treatment Universal protocol:    Procedure explained and questions answered to patient or proxy's satisfaction: yes     Relevant documents present and verified: yes     Patient identity confirmed:  Verbally with patient and arm band Laceration details:    Length (cm):  9 Pre-procedure details:    Preparation:  Patient was prepped and draped in usual sterile fashion Treatment:    Area cleansed with:  Saline and povidone-iodine   Amount of cleaning:  Extensive   Irrigation solution:  Sterile saline   Irrigation method:  Tap   Debridement:  None   Undermining:  None Skin repair:    Repair method:  Sutures   Suture size:  5-0   Suture material:  Prolene   Suture technique:  Simple interrupted and running (Mix of running and simple interrupted)   Number of sutures:  11 Approximation:    Approximation:  Close Repair type:    Repair type:  Simple Post-procedure details:    Dressing:  Non-adherent dressing   Procedure completion:  Tolerated well, no immediate complications    Medications Ordered in the ED  lidocaine -EPINEPHrine (XYLOCAINE  W/EPI) 2 %-1:200000 (PF) injection 10 mL (has no administration in time range)  Tdap (BOOSTRIX) injection 0.5 mL (has no administration in time range)                                    Medical Decision Making Amount and/or Complexity of Data Reviewed Radiology: ordered.  Risk OTC drugs. Prescription drug management.   Presents with 9 cm laceration to the right shin.  X-ray negative for acute fracture. See procedure note  for wound repair. Discussed sutures will need to be removed in 7 days. Wound care discussed.  Patient discharged in stable condition.  Patient and daughter voiced understanding and are in agreement with plan.   Final diagnoses:  Laceration of right lower extremity, initial encounter    ED Discharge Orders          Ordered    cephALEXin  (KEFLEX ) 500 MG capsule  3 times daily        02/21/24 1951               Hildegard Loge, PA-C 02/21/24  [address]    Ruthe Cornet, DO 02/21/24 2003

## 2024-02-21 NOTE — Discharge Instructions (Addendum)
 You had 11 stitches placed in the laceration.  These will need to be removed in 7 days.  Apply Neosporin over the area.  Keflex  prescribed.  Follow-up with your primary care doctor.  The stitches will need to be removed in 7 days.  Your primary care doctor can remove these or you can return to the emergency department.  If you notice any signs of infection return for evaluation.  X-ray did not show any concerning findings.

## 2024-02-28 ENCOUNTER — Encounter (HOSPITAL_BASED_OUTPATIENT_CLINIC_OR_DEPARTMENT_OTHER): Payer: Self-pay | Admitting: Emergency Medicine

## 2024-02-28 ENCOUNTER — Emergency Department (HOSPITAL_BASED_OUTPATIENT_CLINIC_OR_DEPARTMENT_OTHER)
Admission: EM | Admit: 2024-02-28 | Discharge: 2024-02-28 | Disposition: A | Discharge status: Home / Self Care | Attending: Emergency Medicine | Admitting: Emergency Medicine

## 2024-02-28 DIAGNOSIS — Z4802 Encounter for removal of sutures: Secondary | ICD-10-CM | POA: Diagnosis present

## 2024-02-28 DIAGNOSIS — Z87891 Personal history of nicotine dependence: Secondary | ICD-10-CM | POA: Insufficient documentation

## 2024-02-28 DIAGNOSIS — Z7951 Long term (current) use of inhaled steroids: Secondary | ICD-10-CM | POA: Insufficient documentation

## 2024-02-28 DIAGNOSIS — J449 Chronic obstructive pulmonary disease, unspecified: Secondary | ICD-10-CM | POA: Diagnosis not present

## 2024-02-28 DIAGNOSIS — Z79899 Other long term (current) drug therapy: Secondary | ICD-10-CM | POA: Insufficient documentation

## 2024-02-28 MED ORDER — CLINDAMYCIN HCL 300 MG PO CAPS: 300.0000 mg | ORAL_CAPSULE | Freq: Three times a day (TID) | ORAL | 0 refills | Status: AC

## 2024-02-28 MED ORDER — CHLORHEXIDINE GLUCONATE 4 % EX SOLN: Freq: Every day | CUTANEOUS | 0 refills | Status: AC | PRN

## 2024-02-28 MED ORDER — BACITRACIN ZINC 500 UNIT/GM EX OINT: 1.0000 | TOPICAL_OINTMENT | Freq: Two times a day (BID) | CUTANEOUS | 0 refills | Status: AC

## 2024-02-28 MED ORDER — CLINDAMYCIN HCL 150 MG PO CAPS
300.0000 mg | ORAL_CAPSULE | Freq: Once | ORAL | Status: AC
  Administered 2024-02-28: 300 mg via ORAL
  Filled 2024-02-28: qty 2

## 2024-02-28 NOTE — ED Notes (Signed)
 DC paperwork given and verbally understood.

## 2024-02-28 NOTE — Discharge Instructions (Addendum)
 As discussed, your sutures were removed today in the emergency department.  S will place on a stronger antibiotic in the form of clindamycin.  Please take this as directed for its entire course.  Continue to wash area gently with Hibiclens solution.  Will send medicine to the pharmacy but he can also get it over-the-counter.  Recommend close follow-up with your primary care for reassessment of area.  Return if you develop new or worsening glucose, fever or other abnormality.  Return if you develop any signs concerning for infection as discussed.  Otherwise, recommend routine follow-up with primary care.

## 2024-02-28 NOTE — ED Notes (Signed)
 Cleaned, stirri striped and rewrapped.

## 2024-02-28 NOTE — ED Provider Notes (Signed)
 [facility] Provider Note   CSN: [id number] Arrival date & time: 02/28/24  9076     Patient presents with: No chief complaint on file.   Meagan Lambert is a 73 y.o. female.   HPI   73 year old female presents emergency department with request of suture removal.  Had 11 sutures replaced on 02/21/2024 on her right shin.  Wound occurred when she was attempting to move a birdbath and it slipped striking her right lower leg.  Was placed on Keflex  at that time of which she has taken the full course.  States that yesterday, did develop some redness around the wound.  Has been using bacitracin  ointment as well as washing it with Dove soap at home.  Denies any fevers, chills, pus drainage.  Concerned that infection may be developing.  Past medical history significant for degenerative disc disease, osteoarthritis, GERD, COPD, anxiety, angina  Prior to Admission medications   Medication Sig Start Date End Date Taking? Authorizing Provider  acetaminophen  (TYLENOL ) 325 MG tablet Take 650 mg by mouth every 6 (six) hours as needed for headache.    [provider]  ALPRAZolam (XANAX) 0.5 MG tablet Take 0.5 mg by mouth at bedtime. Take 1-1 1/2 tablets at bedtime as needed. 03/26/12   [provider]  Budeson-Glycopyrrol-Formoterol  (BREZTRI  AEROSPHERE) 160-9-4.8 MCG/ACT AERO Inhale 2 puffs into the lungs 2 (two) times daily. Patient not taking: Reported on 11/13/2023 10/24/23   Wert, Michael B, MD  calcium carbonate (TUMS - DOSED IN MG ELEMENTAL CALCIUM) 500 MG chewable tablet Chew 1 tablet by mouth daily.    [provider]  cephALEXin  (KEFLEX ) 500 MG capsule Take 1 capsule (500 mg total) by mouth 3 (three) times daily for 7 days. 02/21/24 02/28/24  Hildegard Loge, PA-C  Cyanocobalamin (B-12 PO) Take by mouth.    [provider]  HYDROcodone -acetaminophen  (LORTAB) 7.5-500 MG per tablet Take 2 tablets by mouth daily as needed for pain.  04/02/12   [provider]  HYDROcodone -acetaminophen  (NORCO) 7.5-325 MG tablet Take 1 tablet by mouth 2 (two) times daily. 02/19/24 03/21/24  [provider]  levothyroxine (SYNTHROID) 88 MCG tablet Take 88 mcg by mouth daily.    [provider]  loratadine (CLARITIN) 10 MG tablet Take 10 mg by mouth daily as needed for allergies.    [provider]  Multiple Vitamins-Minerals (ZINC  PO) Take by mouth.    [provider]  pantoprazole (PROTONIX) 40 MG tablet Take 40 mg by mouth daily. 02/06/24   [provider]  PARoxetine (PAXIL) 20 MG tablet Take by mouth.    [provider]  PARoxetine (PAXIL) 30 MG tablet Take 30 mg by mouth daily.    [provider]  Respiratory Therapy Supplies (FLUTTER) DEVI Use as directed 09/04/18   Wert, Michael B, MD  VITAMIN D PO Take by mouth.    [provider]    Allergies: Sulfa antibiotics    Review of Systems  All other systems reviewed and are negative.   Updated Vital Signs There were no vitals taken for this visit.  Physical Exam Vitals and nursing note reviewed.  Constitutional:      General: She is not in acute distress.    Appearance: She is well-developed.  HENT:     Head: Normocephalic and atraumatic.  Eyes:     Conjunctiva/sclera: Conjunctivae normal.  Cardiovascular:     Rate and Rhythm: Normal rate and regular rhythm.  Pulmonary:  Effort: Pulmonary effort is normal. No respiratory distress.     Breath sounds: Normal breath sounds.  Abdominal:     Palpations: Abdomen is soft.     Tenderness: There is no abdominal tenderness.  Musculoskeletal:        General: No swelling.     Cervical back: Neck supple.  Skin:    General: Skin is warm and dry.     Capillary Refill: Capillary refill takes less than 2 seconds.     Comments: Wound right lower extremity well-approximated.  Extending erythema around 2.0 cm from wound edge without area of palpable fluctuance.   Bedside ultrasound performed did not show evidence of fluid collection but with cellulitic skin changes.  Neurological:     Mental Status: She is alert.  Psychiatric:        Mood and Affect: Mood normal.     (all labs ordered are listed, but only abnormal results are displayed) Labs Reviewed - No data to display  EKG: None  Radiology: No results found.   Suture Removal  Date/Time: 02/28/2024 9:28 AM  Performed by: Silver Wonda LABOR, PA Authorized by: Silver Wonda LABOR, PA   Consent:    Consent obtained:  Verbal   Consent given by:  Patient   Risks, benefits, and alternatives were discussed: yes     Risks discussed:  Bleeding, pain and wound separation   Alternatives discussed:  No treatment, delayed treatment and alternative treatment Universal protocol:    Procedure explained and questions answered to patient or proxy's satisfaction: yes     Patient identity confirmed:  Arm band Location:    Location:  Lower extremity   Lower extremity location:  Leg   Leg location:  R lower leg Procedure details:    Wound appearance:  Clean, red, warm and tender   Number of sutures removed:  11 Post-procedure details:    Post-removal:  No dressing applied   Procedure completion:  Tolerated well, no immediate complications .Ultrasound ED Soft Tissue  Date/Time: 02/28/2024 10:16 AM  Performed by: Silver Wonda LABOR, PA Authorized by: Silver Wonda LABOR, PA   Procedure details:    Indications: evaluate for cellulitis     Transverse view:  Visualized   Longitudinal view:  Visualized   Images: archived   Location:    Location: lower extremity     Side:  Right Findings:     no abscess present    cellulitis present    no foreign body present    Medications Ordered in the ED - No data to display                                  Medical Decision Making Risk OTC drugs. Prescription drug management.   This patient presents to the ED for concern of suture removal, this  involves an extensive number of treatment options, and is a complaint that carries with it a high risk of complications and morbidity.  The differential diagnosis includes cellulitis, abscess, or Supples, necrotizing infection, wound dehiscence, osteomyelitis, other   Co morbidities that complicate the patient evaluation  See HPI   Additional history obtained:  Additional history obtained from EMR External records from outside source obtained and reviewed including hospital records   Lab Tests:  N/a   Imaging Studies ordered:  N/a   Cardiac Monitoring: / EKG:  N/a   Consultations Obtained:  N/a   Problem List / ED  Course / Critical interventions / Medication management  Suture removal Reevaluation of the patient showed that the patient stayed the same I have reviewed the patients home medicines and have made adjustments as needed   Social Determinants of Health:  Former cigarette use.  Denies illicit drug use.   Test / Admission - Considered:  Suture Removal, cellulitis Vitals signs within normal range and stable throughout visit. 73 year old female presents emergency department with request of suture removal.  Had 11 sutures replaced on 02/21/2024 on her right shin.  Wound occurred when she was attempting to move a birdbath and it slipped striking her right lower leg.  Was placed on Keflex  at that time of which she has taken the full course.  States that yesterday, did develop some redness around the wound.  Has been using bacitracin  ointment as well as washing it with Dove soap at home.  Denies any fevers, chills, pus drainage.  Concerned that infection may be developing. On exam, wound approximated with skin changes concerning for cellulitis.  Bedside ultrasound did not show evidence of fluid collection.  Shared decision-making conversation was had with patient regarding potential of sutures versus removal with Steri-Strips placed given concern for possible nidus  of which she elected for removal.  Area was cleansed with Steri-Strips placed.  Erythema not streaking up leg without systemic symptoms concerning for sepsis.  Will broaden spectrum of oral antibiotics patient is on at home in the form of clindamycin.  Will recommend continued wound care at home and follow-up with primary care closely.  Would recommend return if worsening redness, fever or other concerning symptoms develop.  Treatment plan discussed with patient and she acknowledged understanding was agreeable to said plan.  Patient overall well-appearing, afebrile in no acute distress upon discharge. Worrisome signs and symptoms were discussed with the patient, and the patient acknowledged understanding to return to the ED if noticed. Patient was stable upon discharge.       Final diagnoses:  Visit for suture removal    ED Discharge Orders     None          Silver Wonda LABOR, [place] 02/28/24 1018    Tegeler, Lonni PARAS, MD 02/28/24 [phone]

## 2024-02-28 NOTE — ED Triage Notes (Signed)
 Here for stitch removal, area is red and warm to touch. States generalized fatigue and malaise. Chills.

## 2024-06-04 ENCOUNTER — Telehealth: Payer: Self-pay

## 2024-06-04 NOTE — Telephone Encounter (Signed)
 Copied from CRM [id number]. Topic: Clinical - Medical Advice >> Jun 04, 2024 11:38 AM Devaughn RAMAN wrote: Reason for CRM: Setphanie with [facility] called and stated patient's PCP Maeola Meres seen patient today in office and wanted to inform Dr.Wert patient is struggling with shortness of breath especially with exhursion and because of this she is not very active and is getting depressed. Sheri wanted to see what Dr.Wert thoughts are for nebulizer treatments and what medicine he recommends and does he want to send it in as an prescription or for her PCP Sherri to send it in, they would like a callback regarding the outcome for the prescription.  [facility] [place] Stephanie-[phone]  Please advise Dr. Darlean pt last seen you 11/13/2023.

## 2024-06-05 NOTE — Telephone Encounter (Signed)
 Called and spoke with Meagan Lambert and advised of Dr. Chari note.  Meagan Lambert said she would let Meagan Lambert know and they will order rx.  Meagan Lambert PCP also wanted Dr. Darlean to be aware they ordered a CT Chest.  Nfn  Routing back to you Dr. Darlean so you are aware.
